# Patient Record
Sex: Male | Born: 1935 | Race: White | Hispanic: No | Marital: Married | Smoking: Former smoker
Health system: Southern US, Community
[De-identification: ages and names within clinical notes are randomized; demographics above are authoritative.]

## PROBLEM LIST (undated history)

## (undated) DIAGNOSIS — I8390 Asymptomatic varicose veins of unspecified lower extremity: Secondary | ICD-10-CM

## (undated) HISTORY — PX: FRACTURE SURGERY: SHX138

## (undated) HISTORY — DX: Asymptomatic varicose veins of unspecified lower extremity: I83.90

---

## 1994-07-30 HISTORY — PX: HERNIA REPAIR: SHX51

## 2006-01-10 ENCOUNTER — Emergency Department: Payer: Self-pay | Admitting: Emergency Medicine

## 2006-01-23 ENCOUNTER — Inpatient Hospital Stay: Payer: Self-pay | Admitting: Internal Medicine

## 2006-01-23 ENCOUNTER — Other Ambulatory Visit: Payer: Self-pay

## 2006-02-04 ENCOUNTER — Ambulatory Visit: Payer: Self-pay | Admitting: Oncology

## 2006-02-05 LAB — PROTIME-INR (CHCC SATELLITE)
INR: 1.8 — ABNORMAL LOW (ref 2.0–3.5)
Protime: 16.7 Seconds — ABNORMAL HIGH (ref 10.6–13.4)

## 2006-02-05 LAB — CBC WITH DIFFERENTIAL (CANCER CENTER ONLY)
BASO#: 0.1 10*3/uL (ref 0.0–0.2)
BASO%: 0.8 % (ref 0.0–2.0)
EOS%: 2.2 % (ref 0.0–7.0)
Eosinophils Absolute: 0.1 10*3/uL (ref 0.0–0.5)
HCT: 45.8 % (ref 38.7–49.9)
HGB: 14.8 g/dL (ref 13.0–17.1)
LYMPH#: 2.1 10*3/uL (ref 0.9–3.3)
LYMPH%: 33.9 % (ref 14.0–48.0)
MCH: 28.4 pg (ref 28.0–33.4)
MCHC: 31.7 g/dL — ABNORMAL LOW (ref 32.0–35.9)
MCV: 90 fL (ref 82–98)
MONO#: 0.4 10*3/uL (ref 0.1–0.9)
MONO%: 6.6 % (ref 0.0–13.0)
NEUT#: 3.6 10*3/uL (ref 1.5–6.5)
NEUT%: 56.5 % (ref 40.0–80.0)
Platelets: 248 10*3/uL (ref 145–400)
RBC: 5.1 10*6/uL (ref 4.20–5.70)
RDW: 12.1 % (ref 10.5–14.6)
WBC: 6.3 10*3/uL (ref 4.0–10.0)

## 2006-02-08 LAB — PROTIME-INR (CHCC SATELLITE): INR: 1.8 — ABNORMAL LOW (ref 2.0–3.5)

## 2006-02-11 LAB — HYPERCOAGULABLE PANEL, COMPREHENSIVE
Anticardiolipin IgA: 13 [APL'U] (ref <13)
Anticardiolipin IgM: <7 [MPL'U] (ref <10)
Beta-2 Glyco I IgG: <4 U/mL (ref <20)
DRVVT 1:1 Mix: 40.1 secs (ref 26.75–42.95)
PTTLA 4:1 Mix: 54.8 secs — ABNORMAL HIGH (ref 30.5–43.1)
Protein C Activity: 48 % — ABNORMAL LOW (ref 91–147)
Protein C, Total: 55 % — ABNORMAL LOW (ref 63–153)
Protein S Activity: 41 % — ABNORMAL LOW (ref 81–180)

## 2006-02-11 LAB — PROTIME-INR (CHCC SATELLITE)

## 2006-02-11 LAB — COMPREHENSIVE METABOLIC PANEL
ALT: 37 U/L (ref 0–40)
AST: 36 U/L (ref 0–37)
Albumin: 4 g/dL (ref 3.5–5.2)
Alkaline Phosphatase: 69 U/L (ref 39–117)
BUN: 10 mg/dL (ref 6–23)
Calcium: 9.4 mg/dL (ref 8.4–10.5)
Chloride: 102 mEq/L (ref 96–112)
Potassium: 4.2 mEq/L (ref 3.5–5.3)
Sodium: 137 mEq/L (ref 135–145)
Total Protein: 6.6 g/dL (ref 6.0–8.3)

## 2006-02-12 LAB — PROTIME-INR (CHCC SATELLITE)

## 2006-02-14 LAB — PROTIME-INR (CHCC SATELLITE)
INR: 2.2 (ref 2.0–3.5)
Protime: 18.3 Seconds — ABNORMAL HIGH (ref 10.6–13.4)

## 2006-02-21 LAB — PROTIME-INR (CHCC SATELLITE): Protime: 19.5 Seconds — ABNORMAL HIGH (ref 10.6–13.4)

## 2006-02-28 LAB — PROTIME-INR (CHCC SATELLITE): INR: 2.6 (ref 2.0–3.5)

## 2006-03-12 LAB — PROTIME-INR (CHCC SATELLITE): INR: 2.8 (ref 2.0–3.5)

## 2006-03-19 ENCOUNTER — Other Ambulatory Visit: Payer: Self-pay

## 2006-03-19 ENCOUNTER — Inpatient Hospital Stay: Payer: Self-pay | Admitting: Internal Medicine

## 2006-03-22 ENCOUNTER — Ambulatory Visit: Payer: Self-pay | Admitting: Oncology

## 2006-03-26 LAB — PROTIME-INR (CHCC SATELLITE)
INR: 2 (ref 2.0–3.5)
Protime: 17.3 Seconds — ABNORMAL HIGH (ref 10.6–13.4)

## 2006-04-09 LAB — PROTIME-INR (CHCC SATELLITE): Protime: 39.6 Seconds — ABNORMAL HIGH (ref 10.6–13.4)

## 2006-05-02 ENCOUNTER — Ambulatory Visit: Payer: Self-pay | Admitting: Oncology

## 2006-06-05 LAB — PROTIME-INR (CHCC SATELLITE)
INR: 2.8 (ref 2.0–3.5)
Protime: 33.6 Seconds — ABNORMAL HIGH (ref 10.6–13.4)

## 2006-07-02 ENCOUNTER — Ambulatory Visit: Payer: Self-pay | Admitting: Oncology

## 2006-07-03 LAB — PROTIME-INR (CHCC SATELLITE)

## 2006-07-16 LAB — PROTIME-INR (CHCC SATELLITE): Protime: 28.8 Seconds — ABNORMAL HIGH (ref 10.6–13.4)

## 2006-11-09 IMAGING — XA DG OUTSIDE FILMS CHEST
5 series · 15 of 24 positions shown · non-contrast
Comparison: none

[Series 1: run · 6 of 19 slices shown (1 of 5)]
[im 1/19]
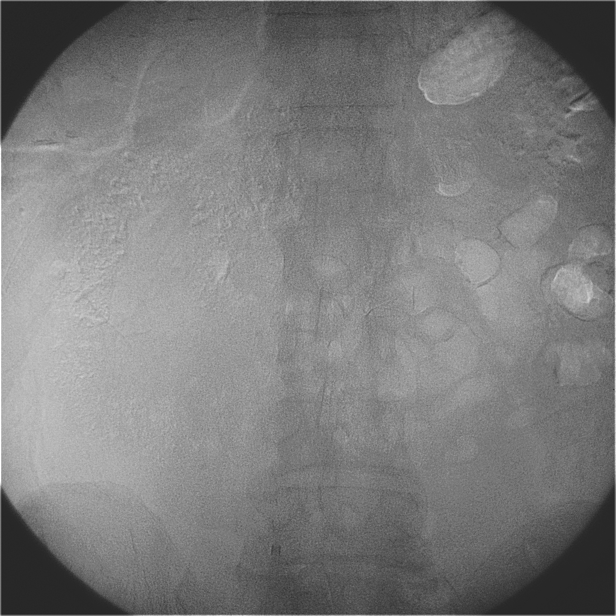
[im 5/19]
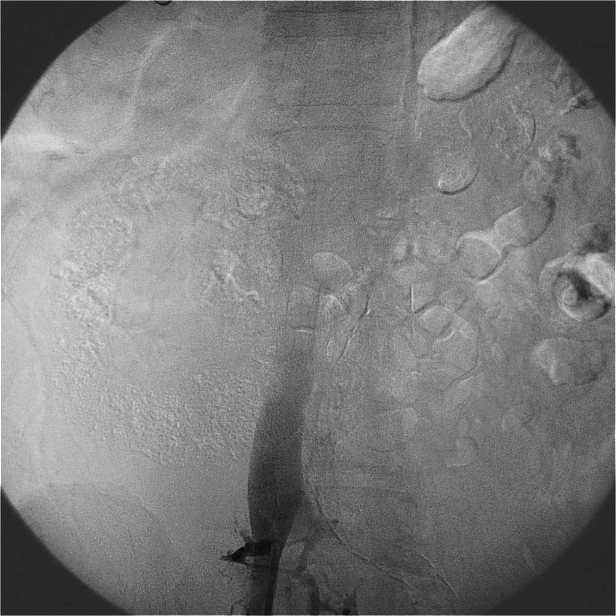
[im 10/19]
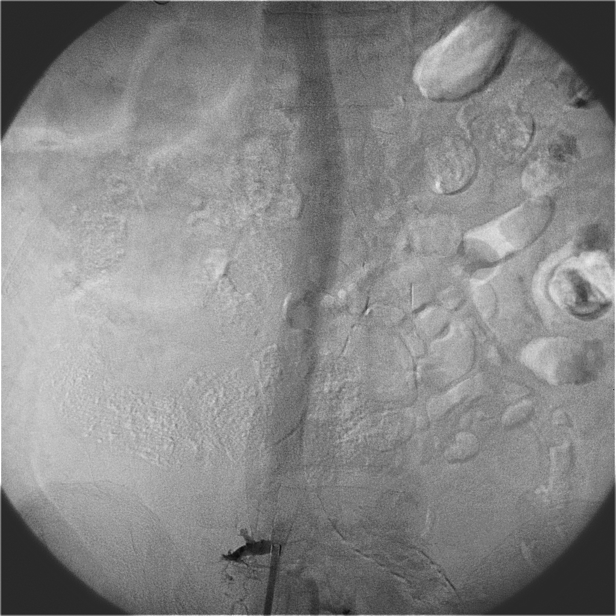
[im 12/19]
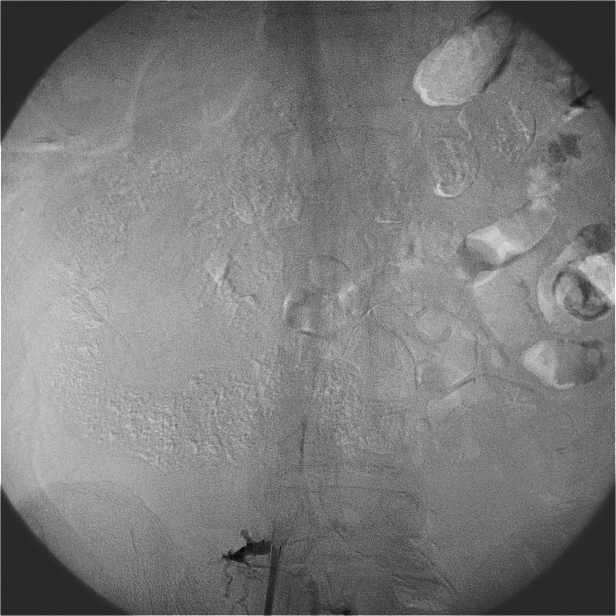
[im 16/19]
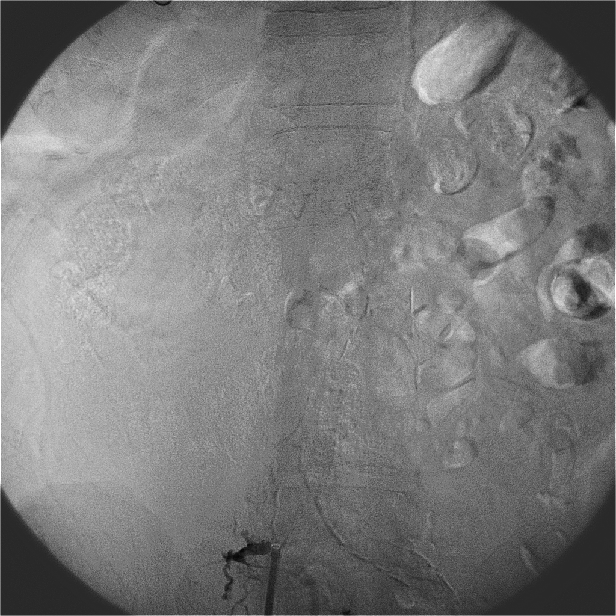
[im 19/19]
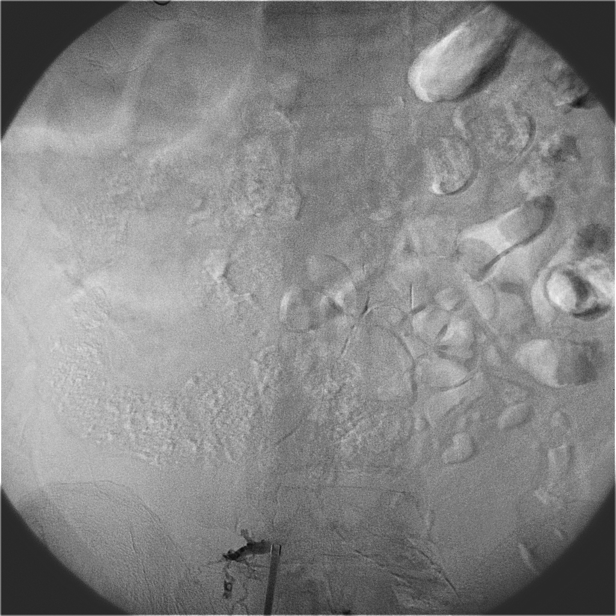

[Series 1: run · 1 of 6 slices shown (2 of 5)]
[im 3/6]
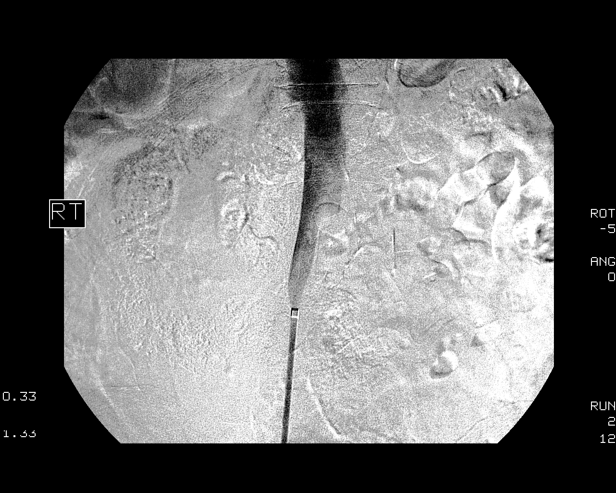

[Series 2: run · 4 of 12 slices shown (3 of 5)]
[im 1/12]
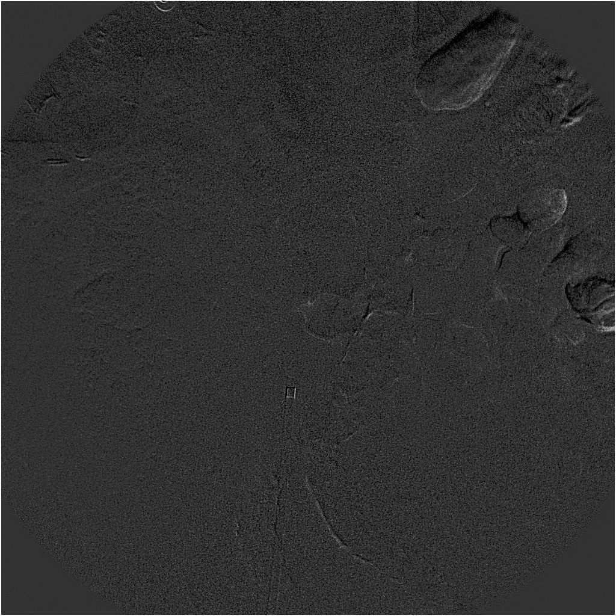
[im 3/12]
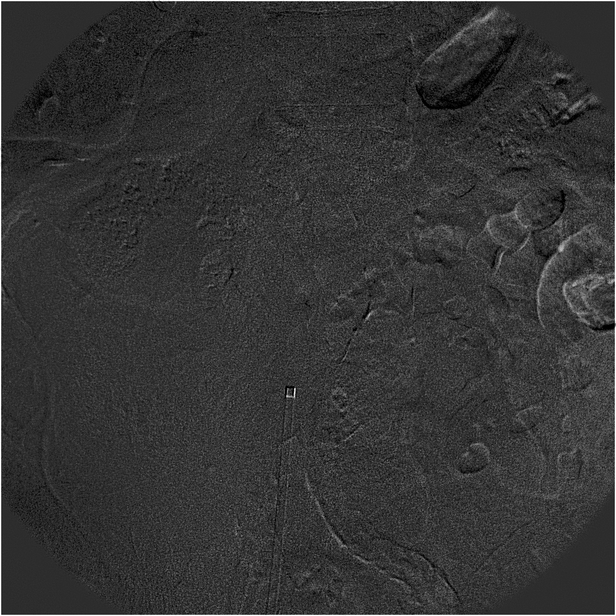
[im 7/12]
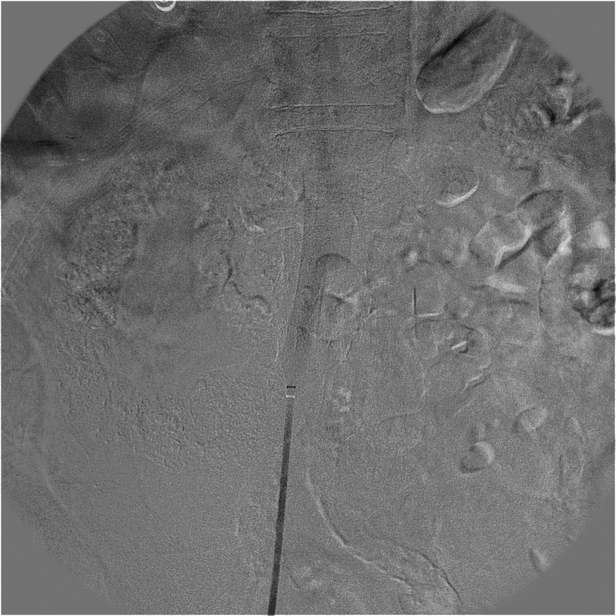
[im 9/12]
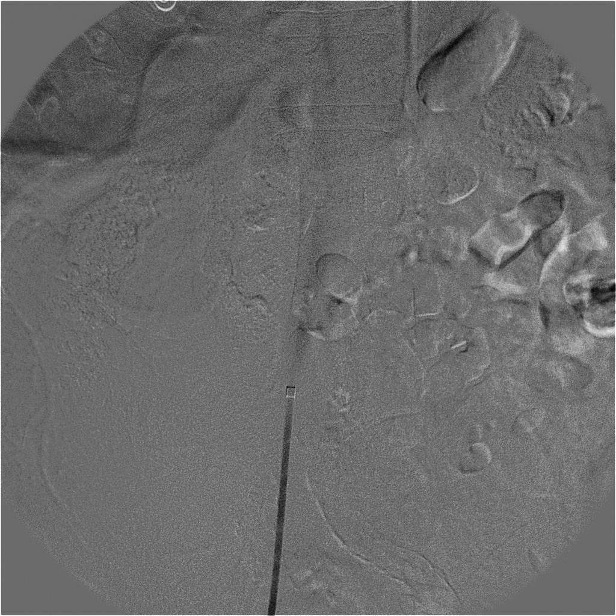

[Series 3: run · 3 of 9 slices shown (4 of 5)]
[im 1/9]
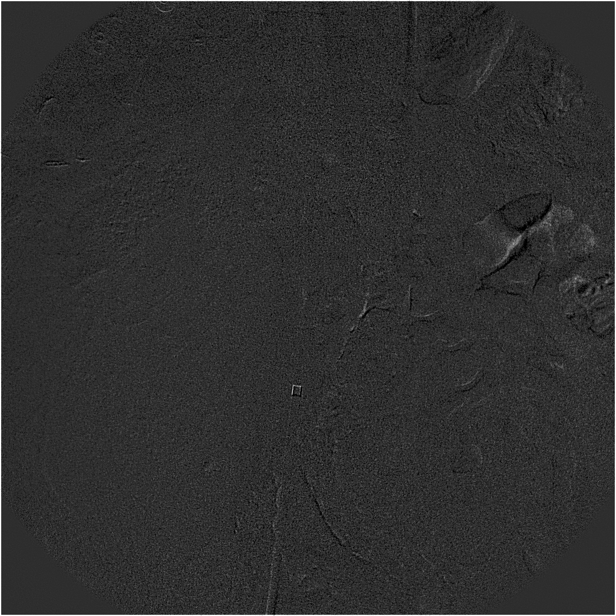
[im 5/9]
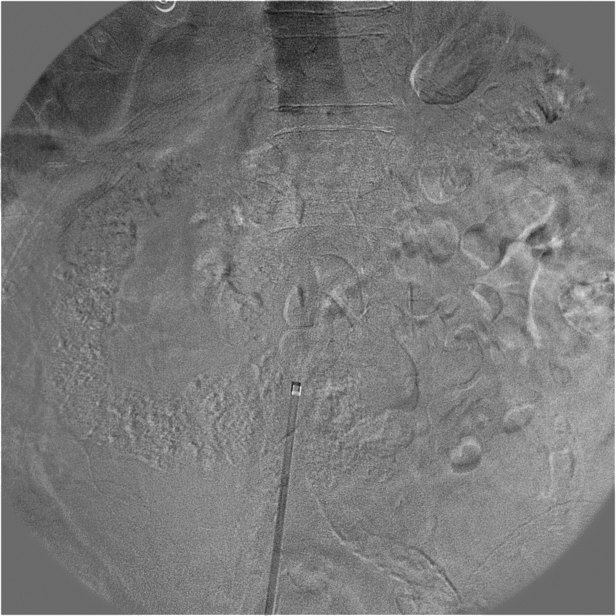
[im 7/9]
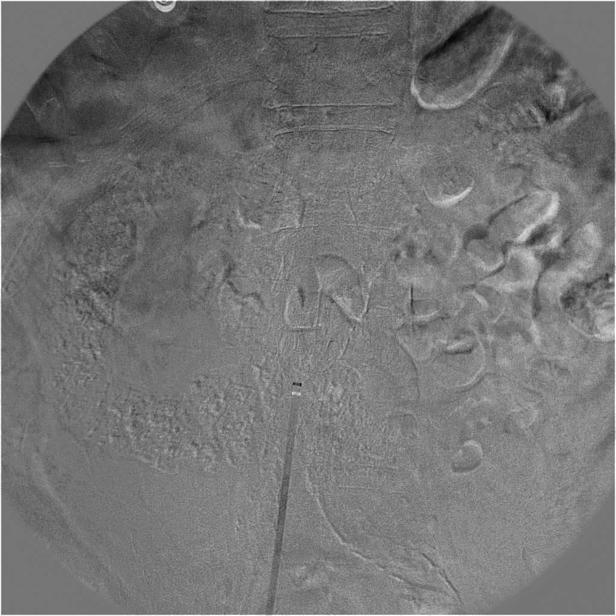

[Series 4: run · 1 of 1 slices shown (5 of 5)]
[im 1/1]
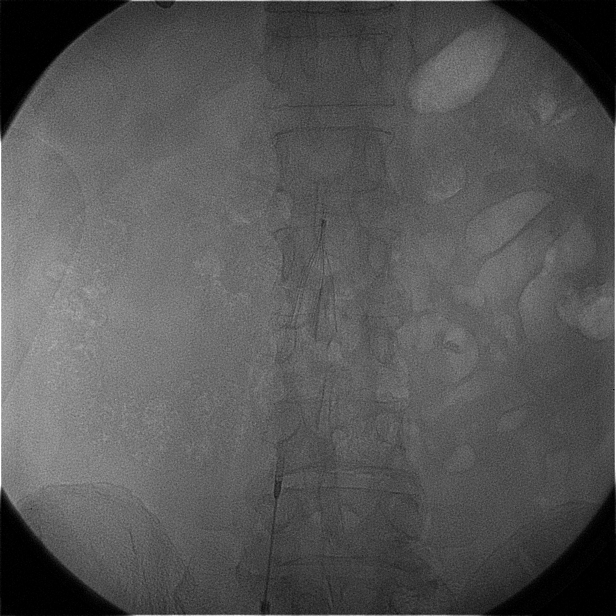

[15 of 24 positions shown; findings below may reference images not displayed]

**** An original report or order could not be provided from the [HOSPITAL] [facility] ****

## 2009-01-05 ENCOUNTER — Ambulatory Visit: Payer: Self-pay | Admitting: Ophthalmology

## 2009-01-17 ENCOUNTER — Ambulatory Visit: Payer: Self-pay | Admitting: Ophthalmology

## 2009-07-27 ENCOUNTER — Ambulatory Visit: Payer: Self-pay | Admitting: Internal Medicine

## 2010-04-27 ENCOUNTER — Ambulatory Visit: Payer: Self-pay | Admitting: Cardiovascular Disease

## 2010-08-25 ENCOUNTER — Ambulatory Visit: Payer: Self-pay | Admitting: Otolaryngology

## 2012-02-19 ENCOUNTER — Encounter: Payer: Self-pay | Admitting: Physician Assistant

## 2012-02-28 ENCOUNTER — Encounter: Payer: Self-pay | Admitting: Physician Assistant

## 2012-11-06 ENCOUNTER — Ambulatory Visit (INDEPENDENT_AMBULATORY_CARE_PROVIDER_SITE_OTHER): Payer: Medicare Other | Admitting: General Surgery

## 2012-11-06 ENCOUNTER — Encounter: Payer: Self-pay | Admitting: General Surgery

## 2012-11-06 VITALS — BP 124/84 | HR 80 | Resp 16 | Ht 67.0 in | Wt 204.0 lb

## 2012-11-06 DIAGNOSIS — I8390 Asymptomatic varicose veins of unspecified lower extremity: Secondary | ICD-10-CM

## 2012-11-06 DIAGNOSIS — I872 Venous insufficiency (chronic) (peripheral): Secondary | ICD-10-CM

## 2012-11-06 DIAGNOSIS — I8393 Asymptomatic varicose veins of bilateral lower extremities: Secondary | ICD-10-CM

## 2012-11-06 NOTE — Patient Instructions (Addendum)
Patient to elevate his feet two times daily. Also at night. This was explained in detail. Also may want to consider a compression pump.

## 2012-11-06 NOTE — Progress Notes (Signed)
Patient ID: Justin Mcdonald, male   DOB: September 01, 1926, 79 y.o.   MRN: [id number]  Chief Complaint  Patient presents with  . Other    veins    HPI Justin Mcdonald is a 79 y.o. male here today for follow up varicose  veins.  Wife states he does not keep his legs elevated enough, swelling some.  Patient denies leg pain. Pt has history of extensive DVT with subsequent recanalisation and now has edema and skin changes in leg from venous insufficiency. Unable to place compression hose or the Circaid.         HPI  Past Medical History  Diagnosis Date  . Varicose vein of leg     Past Surgical History  Procedure Laterality Date  . Fracture surgery  294    leg  . Hernia repair  1996    History reviewed. No pertinent family history.  Social History History  Substance Use Topics  . Smoking status: Former Games developer  . Smokeless tobacco: Not on file  . Alcohol Use: No    No Known Allergies  No current outpatient prescriptions on file.   No current facility-administered medications for this visit.    Review of Systems Review of Systems  Constitutional: Negative.   Respiratory: Negative.   Cardiovascular: Negative.     Blood pressure 124/84, pulse 80, resp. rate 16, height 5\' 7"  (1.702 m), weight 204 lb (92.534 kg).  Physical Exam Physical Exam  Constitutional: He appears well-developed and well-nourished.  Neck: Normal range of motion. Neck supple.  Cardiovascular: Normal rate, regular rhythm and normal heart sounds.   Pulses:      Dorsalis pedis pulses are 3+ on the right side, and 3+ on the left side.       Posterior tibial pulses are 3+ on the right side, and 3+ on the left side.  There mild to moderate edema i both legs with brown discoloration of skin in lower 1/3 of both legs-left more than right. No skin induration or tenderness noted.  Pulmonary/Chest: Effort normal.  Abdominal: Soft. Normal appearance.  Skin:  Patient has not been able to wear any stoking's due to can't get  them on.    Data Reviewed none  Assessment    Chronic venous insufficiency.     Plan    Discussed need for compression, leg elevation.        Ammara Raj G 11/07/2012, 7:12 AM

## 2012-11-07 ENCOUNTER — Encounter: Payer: Self-pay | Admitting: General Surgery

## 2012-11-07 DIAGNOSIS — I872 Venous insufficiency (chronic) (peripheral): Secondary | ICD-10-CM | POA: Insufficient documentation

## 2013-05-19 ENCOUNTER — Ambulatory Visit: Payer: PRIVATE HEALTH INSURANCE | Admitting: General Surgery

## 2013-06-16 ENCOUNTER — Encounter: Payer: Self-pay | Admitting: *Deleted

## 2013-07-02 ENCOUNTER — Encounter: Payer: Self-pay | Admitting: General Surgery

## 2013-07-02 ENCOUNTER — Ambulatory Visit (INDEPENDENT_AMBULATORY_CARE_PROVIDER_SITE_OTHER): Payer: Medicare Other | Admitting: General Surgery

## 2013-07-02 VITALS — BP 110/68 | HR 86 | Resp 14 | Ht 67.0 in | Wt 206.0 lb

## 2013-07-02 DIAGNOSIS — I872 Venous insufficiency (chronic) (peripheral): Secondary | ICD-10-CM

## 2013-07-02 NOTE — Progress Notes (Signed)
Patient presents for a follow up of varicose veins. The patient states he has occasional pain and swelling in the lower extremities. He does not wear his compression stockings due to not having the strength to put them on.  Pt has history of extensive DVT with subsequent recanalisation and now has edema and skin changes in leg from venous insufficiency. Unable to place compression hose or the Circaid. Exam shows both legs with minimal edema, much better than in the past. The stasis skin pigmentation is noted in the lower 1/3 of both sides.  Feet are warm. DP and PT pulses are 2+ on both sides.  Patient to return prn. Advised to elevated his legs as much as possible since he has trouble with compression hose. Potential problems of venous stasis explained to him and his wife.

## 2013-07-02 NOTE — Patient Instructions (Signed)
Return as needed

## 2013-09-16 LAB — BASIC METABOLIC PANEL
Anion Gap: 6 — ABNORMAL LOW (ref 7–16)
BUN: 18 mg/dL (ref 7–18)
CHLORIDE: 105 mmol/L (ref 98–107)
Calcium, Total: 9.3 mg/dL (ref 8.5–10.1)
Co2: 27 mmol/L (ref 21–32)
Creatinine: 0.92 mg/dL (ref 0.60–1.30)
EGFR (Non-African Amer.): >60
Glucose: 113 mg/dL — ABNORMAL HIGH (ref 65–99)
Osmolality: 278 (ref 275–301)
POTASSIUM: 3.7 mmol/L (ref 3.5–5.1)
SODIUM: 138 mmol/L (ref 136–145)

## 2013-09-16 LAB — CBC WITH DIFFERENTIAL/PLATELET
Basophil #: 0 10*3/uL (ref 0.0–0.1)
Basophil %: 0.3 %
Eosinophil #: 0.3 10*3/uL (ref 0.0–0.7)
Eosinophil %: 2.4 %
HCT: 46.4 % (ref 40.0–52.0)
HGB: 14.9 g/dL (ref 13.0–18.0)
LYMPHS ABS: 3 10*3/uL (ref 1.0–3.6)
LYMPHS PCT: 28.3 %
MCH: 29.2 pg (ref 26.0–34.0)
MCHC: 32.1 g/dL (ref 32.0–36.0)
MCV: 91 fL (ref 80–100)
Monocyte #: 1.3 x10 3/mm — ABNORMAL HIGH (ref 0.2–1.0)
Monocyte %: 12.4 %
NEUTROS PCT: 56.6 %
Neutrophil #: 6.1 10*3/uL (ref 1.4–6.5)
Platelet: 214 10*3/uL (ref 150–440)
RBC: 5.1 10*6/uL (ref 4.40–5.90)
RDW: 13.3 % (ref 11.5–14.5)
WBC: 10.7 10*3/uL — ABNORMAL HIGH (ref 3.8–10.6)

## 2013-09-16 LAB — TROPONIN I: Troponin-I: <0.02 ng/mL

## 2013-09-17 ENCOUNTER — Inpatient Hospital Stay: Payer: Self-pay | Admitting: Internal Medicine

## 2013-09-17 LAB — TROPONIN I: Troponin-I: <0.02 ng/mL

## 2013-09-17 LAB — URINALYSIS, COMPLETE
BLOOD: NEGATIVE
Bilirubin,UR: NEGATIVE
Glucose,UR: NEGATIVE mg/dL (ref 0–75)
Hyaline Cast: 10
Ketone: NEGATIVE
LEUKOCYTE ESTERASE: NEGATIVE
NITRITE: NEGATIVE
Ph: 5 (ref 4.5–8.0)
Protein: 30
Specific Gravity: 1.028 (ref 1.003–1.030)
WBC UR: 3 /HPF (ref 0–5)

## 2013-09-17 LAB — BASIC METABOLIC PANEL
Anion Gap: 5 — ABNORMAL LOW (ref 7–16)
BUN: 19 mg/dL — ABNORMAL HIGH (ref 7–18)
CALCIUM: 9.2 mg/dL (ref 8.5–10.1)
CREATININE: 0.8 mg/dL (ref 0.60–1.30)
Chloride: 107 mmol/L (ref 98–107)
Co2: 25 mmol/L (ref 21–32)
EGFR (African American): >60
EGFR (Non-African Amer.): >60
Glucose: 104 mg/dL — ABNORMAL HIGH (ref 65–99)
OSMOLALITY: 276 (ref 275–301)
POTASSIUM: 4.1 mmol/L (ref 3.5–5.1)
Sodium: 137 mmol/L (ref 136–145)

## 2013-09-17 LAB — LIPID PANEL
Cholesterol: 148 mg/dL (ref 0–200)
HDL: 33 mg/dL — AB (ref 40–60)
LDL CHOLESTEROL, CALC: 98 mg/dL (ref 0–100)
Triglycerides: 86 mg/dL (ref 0–200)
VLDL CHOLESTEROL, CALC: 17 mg/dL (ref 5–40)

## 2013-09-17 LAB — PRO B NATRIURETIC PEPTIDE: B-Type Natriuretic Peptide: 51 pg/mL (ref 0–450)

## 2013-09-17 LAB — WBC: WBC: 9.2 10*3/uL (ref 3.8–10.6)

## 2013-09-17 LAB — CK-MB
CK-MB: 0.9 ng/mL (ref 0.5–3.6)
CK-MB: 1.4 ng/mL (ref 0.5–3.6)
CK-MB: 1 ng/mL (ref 0.5–3.6)

## 2013-09-17 LAB — TSH: Thyroid Stimulating Horm: 0.79 u[IU]/mL

## 2013-09-18 LAB — CBC WITH DIFFERENTIAL/PLATELET
BASOS PCT: 0.2 %
Basophil #: 0 10*3/uL (ref 0.0–0.1)
Eosinophil #: 0.1 10*3/uL (ref 0.0–0.7)
Eosinophil %: 1 %
HCT: 37 % — AB (ref 40.0–52.0)
HGB: 12.2 g/dL — AB (ref 13.0–18.0)
Lymphocyte #: 1.4 10*3/uL (ref 1.0–3.6)
Lymphocyte %: 16.7 %
MCH: 29.7 pg (ref 26.0–34.0)
MCHC: 32.9 g/dL (ref 32.0–36.0)
MCV: 90 fL (ref 80–100)
MONOS PCT: 16 %
Monocyte #: 1.4 x10 3/mm — ABNORMAL HIGH (ref 0.2–1.0)
Neutrophil #: 5.7 10*3/uL (ref 1.4–6.5)
Neutrophil %: 66.1 %
Platelet: 127 10*3/uL — ABNORMAL LOW (ref 150–440)
RBC: 4.1 10*6/uL — ABNORMAL LOW (ref 4.40–5.90)
RDW: 13.2 % (ref 11.5–14.5)
WBC: 8.6 10*3/uL (ref 3.8–10.6)

## 2013-09-18 LAB — BASIC METABOLIC PANEL
ANION GAP: 5 — AB (ref 7–16)
BUN: 16 mg/dL (ref 7–18)
Calcium, Total: 8.3 mg/dL — ABNORMAL LOW (ref 8.5–10.1)
Chloride: 108 mmol/L — ABNORMAL HIGH (ref 98–107)
Co2: 26 mmol/L (ref 21–32)
Creatinine: 0.6 mg/dL (ref 0.60–1.30)
Glucose: 102 mg/dL — ABNORMAL HIGH (ref 65–99)
OSMOLALITY: 279 (ref 275–301)
Potassium: 3.9 mmol/L (ref 3.5–5.1)
Sodium: 139 mmol/L (ref 136–145)

## 2013-09-18 LAB — URINE CULTURE

## 2013-09-20 LAB — CBC WITH DIFFERENTIAL/PLATELET
BASOS PCT: 0.4 %
Basophil #: 0 10*3/uL (ref 0.0–0.1)
EOS ABS: 0.1 10*3/uL (ref 0.0–0.7)
Eosinophil %: 1.4 %
HCT: 37 % — ABNORMAL LOW (ref 40.0–52.0)
HGB: 12 g/dL — ABNORMAL LOW (ref 13.0–18.0)
LYMPHS ABS: 1.6 10*3/uL (ref 1.0–3.6)
LYMPHS PCT: 18.6 %
MCH: 29.1 pg (ref 26.0–34.0)
MCHC: 32.4 g/dL (ref 32.0–36.0)
MCV: 90 fL (ref 80–100)
MONO ABS: 1.5 x10 3/mm — AB (ref 0.2–1.0)
Monocyte %: 18.2 %
NEUTROS ABS: 5.2 10*3/uL (ref 1.4–6.5)
Neutrophil %: 61.4 %
Platelet: 154 10*3/uL (ref 150–440)
RBC: 4.12 10*6/uL — AB (ref 4.40–5.90)
RDW: 13.2 % (ref 11.5–14.5)
WBC: 8.4 10*3/uL (ref 3.8–10.6)

## 2013-09-22 DIAGNOSIS — R55 Syncope and collapse: Secondary | ICD-10-CM

## 2013-09-22 DIAGNOSIS — J449 Chronic obstructive pulmonary disease, unspecified: Secondary | ICD-10-CM

## 2013-09-22 DIAGNOSIS — F068 Other specified mental disorders due to known physiological condition: Secondary | ICD-10-CM

## 2013-09-22 DIAGNOSIS — I82409 Acute embolism and thrombosis of unspecified deep veins of unspecified lower extremity: Secondary | ICD-10-CM

## 2013-10-01 DIAGNOSIS — F068 Other specified mental disorders due to known physiological condition: Secondary | ICD-10-CM

## 2013-10-01 DIAGNOSIS — I82409 Acute embolism and thrombosis of unspecified deep veins of unspecified lower extremity: Secondary | ICD-10-CM

## 2013-10-01 DIAGNOSIS — J449 Chronic obstructive pulmonary disease, unspecified: Secondary | ICD-10-CM

## 2013-10-01 DIAGNOSIS — I872 Venous insufficiency (chronic) (peripheral): Secondary | ICD-10-CM

## 2013-11-06 ENCOUNTER — Inpatient Hospital Stay (HOSPITAL_COMMUNITY)
Admission: EM | Admit: 2013-11-06 | Discharge: 2013-11-27 | DRG: 871 | Disposition: E | Payer: Medicare Other | Attending: Internal Medicine | Admitting: Internal Medicine

## 2013-11-06 ENCOUNTER — Emergency Department (HOSPITAL_COMMUNITY): Payer: Medicare Other

## 2013-11-06 ENCOUNTER — Encounter (HOSPITAL_COMMUNITY): Payer: Self-pay | Admitting: Emergency Medicine

## 2013-11-06 DIAGNOSIS — I5032 Chronic diastolic (congestive) heart failure: Secondary | ICD-10-CM

## 2013-11-06 DIAGNOSIS — R29898 Other symptoms and signs involving the musculoskeletal system: Secondary | ICD-10-CM | POA: Diagnosis present

## 2013-11-06 DIAGNOSIS — I82509 Chronic embolism and thrombosis of unspecified deep veins of unspecified lower extremity: Secondary | ICD-10-CM | POA: Diagnosis present

## 2013-11-06 DIAGNOSIS — Q2111 Secundum atrial septal defect: Secondary | ICD-10-CM | POA: Diagnosis present

## 2013-11-06 DIAGNOSIS — R4701 Aphasia: Secondary | ICD-10-CM | POA: Diagnosis present

## 2013-11-06 DIAGNOSIS — J209 Acute bronchitis, unspecified: Secondary | ICD-10-CM

## 2013-11-06 DIAGNOSIS — Z7901 Long term (current) use of anticoagulants: Secondary | ICD-10-CM

## 2013-11-06 DIAGNOSIS — E876 Hypokalemia: Secondary | ICD-10-CM | POA: Diagnosis present

## 2013-11-06 DIAGNOSIS — J9601 Acute respiratory failure with hypoxia: Secondary | ICD-10-CM | POA: Diagnosis present

## 2013-11-06 DIAGNOSIS — R531 Weakness: Secondary | ICD-10-CM

## 2013-11-06 DIAGNOSIS — G9340 Encephalopathy, unspecified: Secondary | ICD-10-CM | POA: Diagnosis present

## 2013-11-06 DIAGNOSIS — Q211 Atrial septal defect: Secondary | ICD-10-CM | POA: Diagnosis present

## 2013-11-06 DIAGNOSIS — A419 Sepsis, unspecified organism: Principal | ICD-10-CM

## 2013-11-06 DIAGNOSIS — D649 Anemia, unspecified: Secondary | ICD-10-CM | POA: Diagnosis present

## 2013-11-06 DIAGNOSIS — R4182 Altered mental status, unspecified: Secondary | ICD-10-CM

## 2013-11-06 DIAGNOSIS — I509 Heart failure, unspecified: Secondary | ICD-10-CM | POA: Diagnosis present

## 2013-11-06 DIAGNOSIS — Z87891 Personal history of nicotine dependence: Secondary | ICD-10-CM

## 2013-11-06 DIAGNOSIS — R7881 Bacteremia: Secondary | ICD-10-CM

## 2013-11-06 DIAGNOSIS — I76 Septic arterial embolism: Secondary | ICD-10-CM | POA: Diagnosis present

## 2013-11-06 DIAGNOSIS — I330 Acute and subacute infective endocarditis: Secondary | ICD-10-CM

## 2013-11-06 DIAGNOSIS — B952 Enterococcus as the cause of diseases classified elsewhere: Secondary | ICD-10-CM

## 2013-11-06 DIAGNOSIS — J9600 Acute respiratory failure, unspecified whether with hypoxia or hypercapnia: Secondary | ICD-10-CM

## 2013-11-06 DIAGNOSIS — I82409 Acute embolism and thrombosis of unspecified deep veins of unspecified lower extremity: Secondary | ICD-10-CM

## 2013-11-06 DIAGNOSIS — R4789 Other speech disturbances: Secondary | ICD-10-CM | POA: Diagnosis present

## 2013-11-06 DIAGNOSIS — R5381 Other malaise: Secondary | ICD-10-CM | POA: Diagnosis present

## 2013-11-06 DIAGNOSIS — J438 Other emphysema: Secondary | ICD-10-CM | POA: Diagnosis present

## 2013-11-06 DIAGNOSIS — Z66 Do not resuscitate: Secondary | ICD-10-CM | POA: Diagnosis present

## 2013-11-06 DIAGNOSIS — Z113 Encounter for screening for infections with a predominantly sexual mode of transmission: Secondary | ICD-10-CM

## 2013-11-06 DIAGNOSIS — F0390 Unspecified dementia without behavioral disturbance: Secondary | ICD-10-CM | POA: Diagnosis present

## 2013-11-06 DIAGNOSIS — I4510 Unspecified right bundle-branch block: Secondary | ICD-10-CM | POA: Diagnosis present

## 2013-11-06 DIAGNOSIS — I63239 Cerebral infarction due to unspecified occlusion or stenosis of unspecified carotid arteries: Secondary | ICD-10-CM

## 2013-11-06 DIAGNOSIS — R319 Hematuria, unspecified: Secondary | ICD-10-CM

## 2013-11-06 DIAGNOSIS — I6340 Cerebral infarction due to embolism of unspecified cerebral artery: Secondary | ICD-10-CM

## 2013-11-06 DIAGNOSIS — B9689 Other specified bacterial agents as the cause of diseases classified elsewhere: Secondary | ICD-10-CM | POA: Diagnosis present

## 2013-11-06 DIAGNOSIS — R5383 Other fatigue: Secondary | ICD-10-CM

## 2013-11-06 DIAGNOSIS — I4891 Unspecified atrial fibrillation: Secondary | ICD-10-CM | POA: Diagnosis not present

## 2013-11-06 DIAGNOSIS — Z86718 Personal history of other venous thrombosis and embolism: Secondary | ICD-10-CM

## 2013-11-06 DIAGNOSIS — I5030 Unspecified diastolic (congestive) heart failure: Secondary | ICD-10-CM

## 2013-11-06 DIAGNOSIS — Z515 Encounter for palliative care: Secondary | ICD-10-CM

## 2013-11-06 LAB — COMPREHENSIVE METABOLIC PANEL
ALT: 18 U/L (ref 0–53)
AST: 29 U/L (ref 0–37)
Albumin: 2.9 g/dL — ABNORMAL LOW (ref 3.5–5.2)
Alkaline Phosphatase: 123 U/L — ABNORMAL HIGH (ref 39–117)
BILIRUBIN TOTAL: 0.7 mg/dL (ref 0.3–1.2)
BUN: 19 mg/dL (ref 6–23)
CALCIUM: 9.3 mg/dL (ref 8.4–10.5)
CHLORIDE: 94 meq/L — AB (ref 96–112)
CO2: 21 mEq/L (ref 19–32)
Creatinine, Ser: 0.68 mg/dL (ref 0.50–1.35)
GFR, EST NON AFRICAN AMERICAN: 85 mL/min — AB (ref >90)
GLUCOSE: 115 mg/dL — AB (ref 70–99)
Potassium: 4.2 mEq/L (ref 3.7–5.3)
SODIUM: 134 meq/L — AB (ref 137–147)
Total Protein: 7.4 g/dL (ref 6.0–8.3)

## 2013-11-06 LAB — I-STAT CHEM 8, ED
BUN: 18 mg/dL (ref 6–23)
Calcium, Ion: 1.12 mmol/L — ABNORMAL LOW (ref 1.13–1.30)
Chloride: 99 mEq/L (ref 96–112)
Creatinine, Ser: 0.9 mg/dL (ref 0.50–1.35)
Glucose, Bld: 121 mg/dL — ABNORMAL HIGH (ref 70–99)
HCT: 41 % (ref 39.0–52.0)
Hemoglobin: 13.9 g/dL (ref 13.0–17.0)
Potassium: 4 mEq/L (ref 3.7–5.3)
SODIUM: 132 meq/L — AB (ref 137–147)
TCO2: 23 mmol/L (ref 0–100)

## 2013-11-06 LAB — URINALYSIS, ROUTINE W REFLEX MICROSCOPIC
GLUCOSE, UA: NEGATIVE mg/dL
Hgb urine dipstick: NEGATIVE
Ketones, ur: 15 mg/dL — AB
Leukocytes, UA: NEGATIVE
Nitrite: NEGATIVE
PH: 6 (ref 5.0–8.0)
PROTEIN: NEGATIVE mg/dL
SPECIFIC GRAVITY, URINE: 1.037 — AB (ref 1.005–1.030)
UROBILINOGEN UA: 1 mg/dL (ref 0.0–1.0)

## 2013-11-06 LAB — RAPID URINE DRUG SCREEN, HOSP PERFORMED
Amphetamines: NOT DETECTED
BENZODIAZEPINES: NOT DETECTED
Barbiturates: NOT DETECTED
Cocaine: NOT DETECTED
Opiates: NOT DETECTED
Tetrahydrocannabinol: NOT DETECTED

## 2013-11-06 LAB — I-STAT ARTERIAL BLOOD GAS, ED
ACID-BASE DEFICIT: 2 mmol/L (ref 0.0–2.0)
BICARBONATE: 21.6 meq/L (ref 20.0–24.0)
O2 Saturation: 99 %
TCO2: 23 mmol/L (ref 0–100)
pCO2 arterial: 33.5 mmHg — ABNORMAL LOW (ref 35.0–45.0)
pH, Arterial: 7.418 (ref 7.350–7.450)
pO2, Arterial: 116 mmHg — ABNORMAL HIGH (ref 80.0–100.0)

## 2013-11-06 LAB — PROTIME-INR
INR: 1.8 — ABNORMAL HIGH (ref 0.00–1.49)
Prothrombin Time: 20.4 seconds — ABNORMAL HIGH (ref 11.6–15.2)

## 2013-11-06 LAB — ETHANOL

## 2013-11-06 LAB — APTT: aPTT: 35 seconds (ref 24–37)

## 2013-11-06 LAB — CBC WITH DIFFERENTIAL/PLATELET
BASOS ABS: 0 10*3/uL (ref 0.0–0.1)
BASOS PCT: 0 % (ref 0–1)
EOS ABS: 0 10*3/uL (ref 0.0–0.7)
EOS PCT: 0 % (ref 0–5)
HCT: 37.5 % — ABNORMAL LOW (ref 39.0–52.0)
Hemoglobin: 12.6 g/dL — ABNORMAL LOW (ref 13.0–17.0)
Lymphocytes Relative: 11 % — ABNORMAL LOW (ref 12–46)
Lymphs Abs: 1.6 10*3/uL (ref 0.7–4.0)
MCH: 27.8 pg (ref 26.0–34.0)
MCHC: 33.6 g/dL (ref 30.0–36.0)
MCV: 82.6 fL (ref 78.0–100.0)
MONO ABS: 1 10*3/uL (ref 0.1–1.0)
Monocytes Relative: 7 % (ref 3–12)
Neutro Abs: 11.7 10*3/uL — ABNORMAL HIGH (ref 1.7–7.7)
Neutrophils Relative %: 82 % — ABNORMAL HIGH (ref 43–77)
Platelets: 247 10*3/uL (ref 150–400)
RBC: 4.54 MIL/uL (ref 4.22–5.81)
RDW: 14.5 % (ref 11.5–15.5)
WBC: 14.4 10*3/uL — ABNORMAL HIGH (ref 4.0–10.5)

## 2013-11-06 LAB — I-STAT TROPONIN, ED: Troponin i, poc: 0.01 ng/mL (ref 0.00–0.08)

## 2013-11-06 LAB — CBG MONITORING, ED: Glucose-Capillary: 115 mg/dL — ABNORMAL HIGH (ref 70–99)

## 2013-11-06 LAB — TROPONIN I: Troponin I: <0.3 ng/mL (ref <0.30)

## 2013-11-06 LAB — I-STAT CG4 LACTIC ACID, ED: Lactic Acid, Venous: 2.21 mmol/L — ABNORMAL HIGH (ref 0.5–2.2)

## 2013-11-06 MED ORDER — CEFEPIME HCL 1 G IJ SOLR
1.0000 g | Freq: Three times a day (TID) | INTRAMUSCULAR | Status: DC
  Administered 2013-11-06: 1 g via INTRAVENOUS
  Filled 2013-11-06 (×3): qty 1

## 2013-11-06 MED ORDER — SODIUM CHLORIDE 0.9 % IV SOLN
1000.0000 mL | Freq: Once | INTRAVENOUS | Status: AC
  Administered 2013-11-06: 1000 mL via INTRAVENOUS

## 2013-11-06 MED ORDER — ACETAMINOPHEN 650 MG RE SUPP
650.0000 mg | RECTAL | Status: AC
  Administered 2013-11-06: 650 mg via RECTAL
  Filled 2013-11-06: qty 1

## 2013-11-06 MED ORDER — SODIUM CHLORIDE 0.9 % IV SOLN: 1000.0000 mL | Freq: Once | INTRAVENOUS | Status: DC

## 2013-11-06 MED ORDER — VANCOMYCIN HCL 10 G IV SOLR
1500.0000 mg | Freq: Once | INTRAVENOUS | Status: AC
  Administered 2013-11-06: 1500 mg via INTRAVENOUS
  Filled 2013-11-06: qty 1500

## 2013-11-06 MED ORDER — SODIUM CHLORIDE 0.9 % IV SOLN: 1000.0000 mL | INTRAVENOUS | Status: DC

## 2013-11-06 MED ORDER — SODIUM CHLORIDE 0.9 % IV SOLN
INTRAVENOUS | Status: AC
  Administered 2013-11-06: 1000 mL via INTRAVENOUS

## 2013-11-06 NOTE — ED Provider Notes (Signed)
CSN: [id number]     Arrival date & time 11/14/2013  2110 History   First MD Initiated Contact with Patient 11/07/2013 2126     Chief Complaint  Patient presents with  . Altered Mental Status     (Consider location/radiation/quality/duration/timing/severity/associated sxs/prior Treatment) The history is provided by the EMS personnel and the spouse.   history of present illness: A 80 year old male with history of DVT on Xarelto who presents via EMS as a code stroke and code stemi. Initial history provided by EMS. They were called out to the patient's residence for altered no status. Later after their arrival, the family reports onset of symptoms approximately 2 hours prior to contacting EMS. On EMS arrival patient was found to be confused, mumbling, with limited movement of the right upper extremity. He was tachycardic, tachypnea, oxygen saturation 77% on room air. He was started on CPAP with improvement in oxygen saturation. Spouse reports patient has not had any recent illness until he became altered today. EMS was concerned for possible STEMI due to read on print out.  Past Medical History  Diagnosis Date  . Varicose vein of leg    Past Surgical History  Procedure Laterality Date  . Fracture surgery  294    leg  . Hernia repair  1996   No family history on file. History  Substance Use Topics  . Smoking status: Former Games developer  . Smokeless tobacco: Not on file  . Alcohol Use: No    Review of Systems  Unable to perform ROS: Mental status change      Allergies  Review of patient's allergies indicates no known allergies.  Home Medications  No current outpatient prescriptions on file. BP 122/62  Pulse 118  Resp 31  SpO2 100% Physical Exam  Nursing note and vitals reviewed. Constitutional: He appears distressed.  HENT:  Head: Normocephalic and atraumatic.  Eyes: Pupils are equal, round, and reactive to light.  Neck: Neck supple.  Cardiovascular: Regular rhythm and normal  pulses.  Tachycardia present.   Pulmonary/Chest: He is in respiratory distress (moderate). He has decreased breath sounds (diminished throughout).  Abdominal: Soft. Normal appearance. There is no tenderness.  Musculoskeletal: He exhibits no edema.  Neurological: He is alert. GCS eye subscore is 4. GCS verbal subscore is 2. GCS motor subscore is 4.  Skin: Skin is warm and dry. No rash noted.    ED Course  Procedures (including critical care time) Labs Review Labs Reviewed - No data to display Imaging Review No results found.   EKG Interpretation None      MDM   Final diagnoses:  None    80 year old male with history of DVT, emphysema, and early dementia who presented initially as a code stroke and code STEMI. Family reported 2 hours of altered mental status preceding symptoms. EMS noted decreased movement of the right upper extremity. Rhythm strip obtained by EMS printed out concern for acute MI. On arrival EKG obtained that showed a right bundle branch block. This was reviewed by cardiology who did not see evidence of acute ST elevation MI. And code ST EMI was canceled.  On arrival patient GCS 10 (4/2/4). Initially no movement of the right upper extremity noted and patient with concern for right-sided neglect. He maintained appropriate oxygen saturation off CPAP but had significantly increased and worsening tachypnea. Tachypnea improved on BiPAP.  With concern for stroke and NIH stroke scale of 18 patient taken directly to CT scan. CT PE study was also obtained with patient's history  of known DVT now with tachypnea and hypoxia.  CT negative for acute ischemia. PE study without evidence of pulmonary embolism or pulmonary edema.  After return from the CT scanner patient was of 102.7. On identification of fever and sepsis called. Patient given IVF, vancomycin and cefepime for empiric HCAP coverage.  Labs remarkable for lactic catheter 2.2, leukocytosis 14.4.  During emergency  department stay patient's mental status improved and he was able to appropriately follow commands. Attempted to wean patient off BiPAP but he remained with tachypnea in the 30s to 40s.  Patient discussed with the critical care attending on call who felt patient appropriate for admission to step down. Hospitalist consult in a.m. will admit to step down for further management.   Date: 11/07/2013  Rate: 121  Rhythm: sinus tachycardia  QRS Axis: normal  Intervals: normal  ST/T Wave abnormalities: normal  Conduction Disutrbances:right bundle branch block  Narrative Interpretation: Sinus tachycardia with RBBB  Old EKG Reviewed: none available and unchanged      Cherre RobinsBryan Sireen Halk, MD 11/07/13 0107

## 2013-11-06 NOTE — ED Notes (Signed)
Pt from home. Pt recently placed on Xyralto for a blood clot in right hip. Was in rehab for generalized weakness and came home earlier today. Wife called EMS and they found pt to be SOB with O2 sats in the 70's. Pt placed on CPAP. 18g PIV inserted in left a/c. Pt unable to follow commands. Right sided weakness noted by EMS.

## 2013-11-06 NOTE — Code Documentation (Signed)
80 [facility] w[facility] with sudden onset Rt side weakness, slurred speech, and resp distress.  Per EMS, pt was at his baseline after coming home from an appt with rehab & his wife later discovered him around 1945 to be altered & having difficulty breathing.  [facility] was on Bip[facility] and nonverbal.  Pt had a Lt gaze preference, Rt side weakness, sensory and visual deficits, and was unable to follow-commands.  Pt is currently taking xarelto for DVT. Pt was incidentally found to be febrile t=102.7 so a code sepsis was also initiated. No acute stroke treatment at this time due to contraindications.  Awaiting wife's arrival for further direction.

## 2013-11-06 NOTE — Progress Notes (Signed)
Dr Christain SacramentoBeaver requested that patient be taken off Bipap and placed on Nasal Cannula.  Patients RR increased into the high 40s to 50s and patient was using more abdominal muscles to breathe.  RT got Dr Christain SacramentoBeaver to come look at patient.  Patient was placed back on bipap at previous settings of 10/5 and 40%.  Tolerating well.  RT will monitor.

## 2013-11-06 NOTE — ED Notes (Signed)
Wife reports pt has intermittent bouts of dementia with memory loss, confusion, and non communication.

## 2013-11-06 NOTE — Progress Notes (Signed)
ANTIBIOTIC CONSULT NOTE - INITIAL  Pharmacy Consult for Cefepime Indication: rule out sepsis  No Known Allergies  Patient Measurements:   From Dec:  Ht: 67 in   Wt: 93 kg  Vital Signs: Temp: 102.7 F (39.3 C) (04/10 2150) Temp src: Rectal (04/10 2150) BP: 122/78 mmHg (04/10 2200) Pulse Rate: 114 (04/10 2200) Intake/Output from previous day:   Intake/Output from this shift:    Labs:  Recent Labs  11/13/2013 2100 10/29/2013 2135  WBC 14.4*  --   HGB 12.6* 13.9  PLT 247  --   CREATININE 0.68 0.90   The CrCl is unknown because both a height and weight (above a minimum accepted value) are required for this calculation. No results found for this basename: VANCOTROUGH, VANCOPEAK, VANCORANDOM, GENTTROUGH, GENTPEAK, GENTRANDOM, TOBRATROUGH, TOBRAPEAK, TOBRARND, AMIKACINPEAK, AMIKACINTROU, AMIKACIN,  in the last 72 hours   Microbiology: No results found for this or any previous visit (from the past 720 hour(s)).  Medical History: Past Medical History  Diagnosis Date  . Varicose vein of leg     Medications:  See electronic med rec  Assessment: 80 y.o. male presents with SOB, R-sided weakness. Code stroke called. To begin broad spectrum antibiotics for sepsis. ED MD ordered Vancomycin x 1 dose and Cefepime per pharmacy.  Goal of Therapy:  Resolution of infection  Plan:  1. Cefepime 1gm IV q8h. 2. Will f/u admit orders for further Vancomycin orders/consult 3. Will f/u micro data, pt's clinical condition, renal function  Christoper Fabianaron Rubert Frediani, PharmD, BCPS Clinical pharmacist, pager [phone] 11/25/2013,10:19 PM

## 2013-11-06 NOTE — ED Notes (Signed)
Pt's wife at the bedside. States that pt has been on Xyralto since 09/15/13.

## 2013-11-06 NOTE — Progress Notes (Signed)
Patient transported to and from CT on BIPAP with no complications.

## 2013-11-06 NOTE — ED Notes (Signed)
BiPap taken off at 2200, put on [place]. Pt's RR increased to upper 40s. Pt placed back on BiPap 40% at 2220. RR rate in the low 30s.

## 2013-11-07 ENCOUNTER — Encounter (HOSPITAL_COMMUNITY): Payer: Self-pay | Admitting: *Deleted

## 2013-11-07 ENCOUNTER — Inpatient Hospital Stay (HOSPITAL_COMMUNITY): Payer: Medicare Other

## 2013-11-07 DIAGNOSIS — I5032 Chronic diastolic (congestive) heart failure: Secondary | ICD-10-CM | POA: Diagnosis present

## 2013-11-07 DIAGNOSIS — I82409 Acute embolism and thrombosis of unspecified deep veins of unspecified lower extremity: Secondary | ICD-10-CM

## 2013-11-07 DIAGNOSIS — R4182 Altered mental status, unspecified: Secondary | ICD-10-CM | POA: Diagnosis present

## 2013-11-07 DIAGNOSIS — I6340 Cerebral infarction due to embolism of unspecified cerebral artery: Secondary | ICD-10-CM | POA: Diagnosis present

## 2013-11-07 DIAGNOSIS — I509 Heart failure, unspecified: Secondary | ICD-10-CM

## 2013-11-07 DIAGNOSIS — J9600 Acute respiratory failure, unspecified whether with hypoxia or hypercapnia: Secondary | ICD-10-CM

## 2013-11-07 DIAGNOSIS — I059 Rheumatic mitral valve disease, unspecified: Secondary | ICD-10-CM

## 2013-11-07 DIAGNOSIS — I63239 Cerebral infarction due to unspecified occlusion or stenosis of unspecified carotid arteries: Secondary | ICD-10-CM

## 2013-11-07 DIAGNOSIS — I5030 Unspecified diastolic (congestive) heart failure: Secondary | ICD-10-CM

## 2013-11-07 DIAGNOSIS — M6281 Muscle weakness (generalized): Secondary | ICD-10-CM

## 2013-11-07 DIAGNOSIS — J209 Acute bronchitis, unspecified: Secondary | ICD-10-CM | POA: Insufficient documentation

## 2013-11-07 DIAGNOSIS — R7881 Bacteremia: Secondary | ICD-10-CM

## 2013-11-07 DIAGNOSIS — A419 Sepsis, unspecified organism: Secondary | ICD-10-CM | POA: Diagnosis present

## 2013-11-07 LAB — BLOOD GAS, ARTERIAL
Acid-base deficit: 0.8 mmol/L (ref 0.0–2.0)
BICARBONATE: 23 meq/L (ref 20.0–24.0)
Delivery systems: POSITIVE
Drawn by: 312761
EXPIRATORY PAP: 5
FIO2: 0.4 %
Inspiratory PAP: 10
MODE: POSITIVE
O2 Saturation: 98.5 %
PH ART: 7.425 (ref 7.350–7.450)
Patient temperature: 98.6
TCO2: 24.1 mmol/L (ref 0–100)
pCO2 arterial: 35.7 mmHg (ref 35.0–45.0)
pO2, Arterial: 131 mmHg — ABNORMAL HIGH (ref 80.0–100.0)

## 2013-11-07 LAB — TROPONIN I
TROPONIN I: 0.43 ng/mL — AB (ref <0.30)
Troponin I: <0.3 ng/mL (ref <0.30)

## 2013-11-07 LAB — COMPREHENSIVE METABOLIC PANEL
ALBUMIN: 2.6 g/dL — AB (ref 3.5–5.2)
ALT: 14 U/L (ref 0–53)
AST: 24 U/L (ref 0–37)
Alkaline Phosphatase: 105 U/L (ref 39–117)
BUN: 18 mg/dL (ref 6–23)
CO2: 22 mEq/L (ref 19–32)
CREATININE: 0.59 mg/dL (ref 0.50–1.35)
Calcium: 8.7 mg/dL (ref 8.4–10.5)
Chloride: 99 mEq/L (ref 96–112)
GFR calc Af Amer: >90 mL/min (ref >90)
GFR calc non Af Amer: 90 mL/min — ABNORMAL LOW (ref >90)
Glucose, Bld: 110 mg/dL — ABNORMAL HIGH (ref 70–99)
Potassium: 3.7 mEq/L (ref 3.7–5.3)
Sodium: 136 mEq/L — ABNORMAL LOW (ref 137–147)
TOTAL PROTEIN: 6.2 g/dL (ref 6.0–8.3)
Total Bilirubin: 0.5 mg/dL (ref 0.3–1.2)

## 2013-11-07 LAB — APTT: aPTT: 40 seconds — ABNORMAL HIGH (ref 24–37)

## 2013-11-07 LAB — INFLUENZA PANEL BY PCR (TYPE A & B)
H1N1 flu by pcr: NOT DETECTED
INFLBPCR: NEGATIVE
Influenza A By PCR: NEGATIVE

## 2013-11-07 LAB — CBC
HCT: 31.4 % — ABNORMAL LOW (ref 39.0–52.0)
Hemoglobin: 10.6 g/dL — ABNORMAL LOW (ref 13.0–17.0)
MCH: 27.5 pg (ref 26.0–34.0)
MCHC: 33.8 g/dL (ref 30.0–36.0)
MCV: 81.3 fL (ref 78.0–100.0)
Platelets: 227 10*3/uL (ref 150–400)
RBC: 3.86 MIL/uL — ABNORMAL LOW (ref 4.22–5.81)
RDW: 14.5 % (ref 11.5–15.5)
WBC: 12.1 10*3/uL — ABNORMAL HIGH (ref 4.0–10.5)

## 2013-11-07 LAB — PRO B NATRIURETIC PEPTIDE: Pro B Natriuretic peptide (BNP): 244.9 pg/mL (ref 0–450)

## 2013-11-07 LAB — AMMONIA: AMMONIA: 32 umol/L (ref 11–60)

## 2013-11-07 LAB — PROTIME-INR
INR: 1.61 — ABNORMAL HIGH (ref 0.00–1.49)
Prothrombin Time: 18.7 seconds — ABNORMAL HIGH (ref 11.6–15.2)

## 2013-11-07 LAB — MRSA PCR SCREENING: MRSA by PCR: NEGATIVE

## 2013-11-07 LAB — TSH: TSH: 1.3 u[IU]/mL (ref 0.350–4.500)

## 2013-11-07 MED ORDER — CHLORHEXIDINE GLUCONATE 0.12 % MT SOLN
15.0000 mL | Freq: Two times a day (BID) | OROMUCOSAL | Status: DC
Start: 2013-11-07 — End: 2013-11-09
  Administered 2013-11-07 – 2013-11-09 (×6): 15 mL via OROMUCOSAL
  Filled 2013-11-07 (×8): qty 15

## 2013-11-07 MED ORDER — IPRATROPIUM-ALBUTEROL 0.5-2.5 (3) MG/3ML IN SOLN
3.0000 mL | Freq: Four times a day (QID) | RESPIRATORY_TRACT | Status: DC
  Administered 2013-11-07 (×2): 3 mL via RESPIRATORY_TRACT
  Filled 2013-11-07 (×3): qty 3

## 2013-11-07 MED ORDER — VANCOMYCIN HCL IN DEXTROSE 1-5 GM/200ML-% IV SOLN
1000.0000 mg | Freq: Two times a day (BID) | INTRAVENOUS | Status: DC
  Administered 2013-11-07 – 2013-11-09 (×5): 1000 mg via INTRAVENOUS
  Filled 2013-11-07 (×6): qty 200

## 2013-11-07 MED ORDER — CARVEDILOL 3.125 MG PO TABS
3.1250 mg | ORAL_TABLET | Freq: Two times a day (BID) | ORAL | Status: DC
  Administered 2013-11-09: 3.125 mg via ORAL
  Filled 2013-11-07 (×6): qty 1

## 2013-11-07 MED ORDER — IPRATROPIUM-ALBUTEROL 0.5-2.5 (3) MG/3ML IN SOLN
3.0000 mL | Freq: Three times a day (TID) | RESPIRATORY_TRACT | Status: DC
  Administered 2013-11-08 – 2013-11-09 (×5): 3 mL via RESPIRATORY_TRACT
  Filled 2013-11-07 (×6): qty 3

## 2013-11-07 MED ORDER — ASPIRIN 300 MG RE SUPP
300.0000 mg | Freq: Every day | RECTAL | Status: DC
  Administered 2013-11-07: 300 mg via RECTAL
  Filled 2013-11-07 (×2): qty 1

## 2013-11-07 MED ORDER — ENOXAPARIN SODIUM 40 MG/0.4ML ~~LOC~~ SOLN
40.0000 mg | SUBCUTANEOUS | Status: DC
  Administered 2013-11-07: 40 mg via SUBCUTANEOUS
  Filled 2013-11-07: qty 0.4

## 2013-11-07 MED ORDER — ALBUMIN HUMAN 5 % IV SOLN
12.5000 g | Freq: Once | INTRAVENOUS | Status: AC
  Administered 2013-11-07: 12.5 g via INTRAVENOUS
  Filled 2013-11-07: qty 250

## 2013-11-07 MED ORDER — IPRATROPIUM-ALBUTEROL 0.5-2.5 (3) MG/3ML IN SOLN: 3.0000 mL | RESPIRATORY_TRACT | Status: DC

## 2013-11-07 MED ORDER — SODIUM CHLORIDE 0.9 % IJ SOLN
3.0000 mL | Freq: Two times a day (BID) | INTRAMUSCULAR | Status: DC
  Administered 2013-11-07 – 2013-11-09 (×6): 3 mL via INTRAVENOUS

## 2013-11-07 MED ORDER — PIPERACILLIN-TAZOBACTAM 3.375 G IVPB
3.3750 g | Freq: Three times a day (TID) | INTRAVENOUS | Status: DC
  Administered 2013-11-07 – 2013-11-09 (×7): 3.375 g via INTRAVENOUS
  Filled 2013-11-07 (×10): qty 50

## 2013-11-07 MED ORDER — ONDANSETRON HCL 4 MG PO TABS: 4.0000 mg | ORAL_TABLET | Freq: Four times a day (QID) | ORAL | Status: DC | PRN

## 2013-11-07 MED ORDER — ONDANSETRON HCL 4 MG/2ML IJ SOLN: 4.0000 mg | Freq: Four times a day (QID) | INTRAMUSCULAR | Status: DC | PRN

## 2013-11-07 MED ORDER — RIVAROXABAN 20 MG PO TABS
20.0000 mg | ORAL_TABLET | Freq: Every day | ORAL | Status: DC
  Administered 2013-11-09: 20 mg via ORAL
  Filled 2013-11-07 (×2): qty 1

## 2013-11-07 MED ORDER — BIOTENE DRY MOUTH MT LIQD
15.0000 mL | Freq: Two times a day (BID) | OROMUCOSAL | Status: DC
  Administered 2013-11-07 – 2013-11-09 (×6): 15 mL via OROMUCOSAL

## 2013-11-07 MED ORDER — QUETIAPINE FUMARATE 25 MG PO TABS
25.0000 mg | ORAL_TABLET | Freq: Every day | ORAL | Status: DC
  Filled 2013-11-07 (×3): qty 1

## 2013-11-07 NOTE — Progress Notes (Signed)
ANTIBIOTIC CONSULT NOTE - INITIAL  Pharmacy Consult for vancomycin and zosyn  Indication: rule out sepsis  No Known Allergies  Patient Measurements: Height: 5\' 8"  (172.7 cm) Weight: 197 lb 12 oz (89.7 kg) IBW/kg (Calculated) : 68.4 Adjusted Body Weight:   Vital Signs: Temp: 97.3 F (36.3 C) (04/11 0117) Temp src: Axillary (04/11 0117) BP: 96/56 mmHg (04/11 0117) Pulse Rate: 96 (04/11 0117) Intake/Output from previous day:   Intake/Output from this shift:    Labs:  Recent Labs  05-Feb-2014 2100 05-Feb-2014 2135  WBC 14.4*  --   HGB 12.6* 13.9  PLT 247  --   CREATININE 0.68 0.90   Estimated Creatinine Clearance: 65.3 ml/min (by C-G formula based on Cr of 0.9). No results found for this basename: VANCOTROUGH, VANCOPEAK, VANCORANDOM, GENTTROUGH, GENTPEAK, GENTRANDOM, TOBRATROUGH, TOBRAPEAK, TOBRARND, AMIKACINPEAK, AMIKACINTROU, AMIKACIN,  in the last 72 hours   Microbiology: No results found for this or any previous visit (from the past 720 hour(s)).  Medical History: Past Medical History  Diagnosis Date  . Varicose vein of leg     Medications:  Prescriptions prior to admission  Medication Sig Dispense Refill  . Rivaroxaban (XARELTO PO) Take 1 tablet by mouth daily.      Marland Kitchen. tiZANidine (ZANAFLEX) 2 MG tablet Take 1 tablet by mouth 2 (two) times daily.      . mirtazapine (REMERON) 30 MG tablet Take 1 tablet by mouth every evening.       Assessment: 80 yo with initial adx for code stemi/stroke now admitted for sepsis. vanc 1500mg  received in ED at 2245 cefepime 1gm at 2300. To continue vancomycin at beging zosyn.   Goal of Therapy:  Vancomycin trough level 15-20 mcg/ml  Plan:  Continue with vancomycin 1gm q12h next dose at 1000. Begin zosyn 3.375 gm q8h at 0600 (covered with cefepime for now)   Janice CoffinWilliam Jonathan Eryk Beavers 11/07/2013,2:53 AM

## 2013-11-07 NOTE — Evaluation (Addendum)
Physical Therapy Evaluation Patient Details Name: Justin Mcdonald MRN: [id number] DOB: 22-Dec-1926 Today's Date: 11/07/2013   History of Present Illness  The patient is a 80 year old male who presented with complaints of altered mental status. Patient was recently [facility], at which time he was found to have DVT and from there he was sent to [facility] where he was discharged 3-4 weeks ago. Patient has been working with home physical therapy. 2 days ago he had a pulled muscle on the in his back on the right side because of which he has been having difficulty moving his right side. He also has been having poor appetite since last few days associated with generalized weakness and tiredness CT (-), PE (-), await MRI  Clinical Impression  Pt currently on bipap and initially lethargic without eyes open with poor interaction. Once EOB eyes open and pt attempting to communicate with head nods although not always clear between yes or no and not following commands for thumb up as well. Pt was at home with wife after recent ST-SNF stay and demonstrates right sided hemiparesis without noticeable movement or muscle contraction of RUE and limited movement of RLE currently. Discussed with wife over the phone who seems convinced current state is related to pt pulling a muscle in his back on the right side recently and lack of eating for a few days. Pt currently is a 2 person assist for bed mobility with pushing to right in sitting. Pt will benefit from acute therapy to maximize mobility, strength, function and balance to decrease burden of care and improve quality of life. Will follow acutely    Follow Up Recommendations CIR;Supervision/Assistance - 24 hour    Equipment Recommendations  None recommended by PT    Recommendations for Other Services OT consult;Speech consult;Rehab consult     Precautions / Restrictions Precautions Precautions: Fall Precaution Comments:  bipap      Mobility  Bed Mobility Overal bed mobility: Needs Assistance;+2 for physical assistance Bed Mobility: Rolling;Sidelying to Sit;Sit to Supine Rolling: Max assist;+2 for physical assistance Sidelying to sit: Total assist;+2 for physical assistance   Sit to supine: +2 for physical assistance;Max assist   General bed mobility comments: pt assisting with rolling with hand over hand assist to reach LUE to rail and max assist initially to bend left knee with facilitation at pelvis and trunk to roll. With side to sit total assist to elevate trunk and transfer and with return to supine pt with assist of gravity able to return trunk to surface with max assist to elevate legs to bed  Transfers                    Ambulation/Gait                Stairs            Wheelchair Mobility    Modified Rankin (Stroke Patients Only) Modified Rankin (Stroke Patients Only) Pre-Morbid Rankin Score: Moderate disability Modified Rankin: Severe disability     Balance Overall balance assessment: Needs assistance Sitting-balance support: Single extremity supported;Feet supported Sitting balance-Leahy Scale: Zero Sitting balance - Comments: without LUE support on rail pt with significant posterior right lean, with LUE on bed pt pushing to right and with pt LUE on foot rail pt able to grasp rail and assist with sitting with mod assist to maintain anterior weight shift Postural control: Posterior lean;Right lateral lean  Pertinent Vitals/Pain CPOT = 0 HR 93-106 with PVC bipap with sats 100%    Home Living Family/patient expects to be discharged to:: Private residence Living Arrangements: Spouse/significant other Available Help at Discharge: Family;Available 24 hours/day Type of Home: House Home Access: Level entry     Home Layout: One level Home Equipment: Walker - 2 wheels;Shower seat;Wheelchair - manual;Grab bars -  tub/shower;Cane - single point      Prior Function Level of Independence: Needs assistance   Gait / Transfers Assistance Needed: pt was walking with RW in home limited distance with wife supervision with WC for transport outside of the home  ADL's / Homemaking Assistance Needed: wife was assisting at times with dressing and supervising bathing but reports pt was doing the bath on his own  Comments: PLOF via wife wilma on the phone     Hand Dominance        Extremity/Trunk Assessment   Upper Extremity Assessment: RUE deficits/detail;LUE deficits/detail;Difficult to assess due to impaired cognition RUE Deficits / Details: pt without withdrawal to pain and no AROM throughout session     LUE Deficits / Details: pt able to move LUE but not following all commands to fully assess at least 2/5 grossly   Lower Extremity Assessment: RLE deficits/detail;LLE deficits/detail;Difficult to assess due to impaired cognition RLE Deficits / Details: no AROM and resistant with knee flexion on command, wiggles toes with touch to foot and toes but no other movement LLE Deficits / Details: grossly 2/5 pt unable to follow all commands for formal assessment     Communication   Communication: Other (comment) (pt on bipap and not attempting verbalization)  Cognition Arousal/Alertness: Lethargic Behavior During Therapy: Flat affect Overall Cognitive Status: Difficult to assess                      General Comments      Exercises General Exercises - Lower Extremity Ankle Circles/Pumps: AROM;Left;5 reps;Supine Heel Slides: AROM;Left;5 reps;Supine      Assessment/Plan    PT Assessment Patient needs continued PT services  PT Diagnosis Abnormality of gait;Hemiplegia dominant side;Altered mental status   PT Problem List Decreased strength;Decreased cognition;Decreased activity tolerance;Decreased balance;Decreased mobility;Cardiopulmonary status limiting activity  PT Treatment  Interventions Functional mobility training;Therapeutic activities;Therapeutic exercise;Patient/family education;Cognitive remediation;Neuromuscular re-education;Balance training   PT Goals (Current goals can be found in the Care Plan section) Acute Rehab PT Goals Patient Stated Goal: wife would like pt to be able to function to return home PT Goal Formulation: With family Time For Goal Achievement: 11/21/13 Potential to Achieve Goals: Fair    Frequency Min 4X/week   Barriers to discharge Decreased caregiver support      Co-evaluation               End of Session   Activity Tolerance: Patient limited by fatigue Patient left: in bed;with call bell/phone within reach;with bed alarm set Nurse Communication: Mobility status;Need for lift equipment         Time: [phone] PT Time Calculation (min): 16 min   Charges:   PT Evaluation $Initial PT Evaluation Tier I: 1 Procedure PT Treatments $Therapeutic Activity: 8-22 mins   PT G Codes:          Malarie Tappen B Dmario Russom 11/07/2013, 9:11 AM Delaney Meigs, PT [phone]

## 2013-11-07 NOTE — Consult Note (Addendum)
Referring Physician: Dr. Posey Pronto.    Chief Complaint: altered mental status, right hemiparesis, left gaze preference, slurred speech.  HPI:                                                                                                                                         Ashford Clouse is an 80 y.o. male with a past medical history significant for recently diagnosed DVT on xarelto, brought in due to the above stated symptoms. Patient is not able to provide reliable information and thus all history is obtained form the chart which indicated that he was recently admitted in February at [facility], at which time he was found to have DVT and from there he was sent to rehabilitation facility from where he was discharged 3-4 weeks ago. Patient has been working with home physical therapy.Today after returning from physical therapy the patient was found nonresponsive and confused and therefore they called EMS. Patient's EKG was read by EMS as ST elevation and therefore code STEMI was called. He was febrile with increase breathing and tachycardia and elevated white count upon arrival and there was also a concern for sepsis. Of note, patient's wife reported that couple of days ago he pulled muscle on the right side because of which he has been having difficulty moving his right side. Upon arrival to ED he had a CT brain that did not show acute intracranial abnormality.  Date last known well: 11/07/13 Time last known well: no completely clear, perhaps 1945pm   tPA Given: no, on xarelto NIHSS: 18 MRS: 3  Past Medical History  Diagnosis Date  . Varicose vein of leg     Past Surgical History  Procedure Laterality Date  . Fracture surgery  89    leg  . Hernia repair  1996    History reviewed. No pertinent family history. Social History:  reports that he has quit smoking. He does not have any smokeless tobacco history on file. He reports that he does not drink alcohol or use illicit  drugs.  Allergies: No Known Allergies  Medications:                                                                                                                           I have reviewed the patient's current medications.  ROS: unable to obtain due to mental status.  History obtained from chart review  Physical exam: lethargic but can be arouse easily, mild respiratory distress.Blood pressure 102/60, pulse 82, temperature 98.2 F (36.8 C), temperature source Axillary, resp. rate 30, height _0  (1.727 m), weight 89.7 kg (197 lb 12 oz), SpO2 100.00%.  Head: normocephalic. Neck: supple, no bruits, no JVD. Cardiac: no murmurs. Lungs: clear. Abdomen: soft, no tender, no mass. Extremities: mild bilateral LE edema. CV: pulses palpable throughout  Neurologic Examination:                                                                                                      Mental Status: Open eyes to verbal commands but is not following commands. No spontaneous language, mumbles. Follows simple commands but inconsistently. Cranial Nerves: II: Discs flat bilaterally; Visual fields grossly normal, pupils equal, round, reactive to light and accommodation III,IV, VI: ptosis not present, extra-ocular motions intact bilaterally V,VII: smile symmetric, facial light touch sensation normal bilaterally VIII: hearing normal bilaterally IX,X: gag reflex present XI: bilateral shoulder shrug XII: midline tongue extension without atrophy or fasciculations Motor: Doesn't move the right side Tone and bulk:normal tone throughout; no atrophy noted Sensory: Pinprick and light touch intact throughout, bilaterally Deep Tendon Reflexes:  2 all over Plantars: Right: upgoing   Left: downgoing Cerebellar: Unable to test. Gait:  Unable to test     Results for orders placed  during the hospital encounter of 11/13/2013 (from the past 48 hour(s))  ETHANOL     Status: None   Collection Time    11/25/2013  9:00 PM      Result Value Ref Range   Alcohol, Ethyl (B) <11  0 - 11 mg/dL   Comment:            LOWEST DETECTABLE LIMIT FOR     SERUM ALCOHOL IS 11 mg/dL     FOR MEDICAL PURPOSES ONLY  PROTIME-INR     Status: Abnormal   Collection Time    11/22/2013  9:00 PM      Result Value Ref Range   Prothrombin Time 20.4 (*) 11.6 - 15.2 seconds   INR 1.80 (*) 0.00 - 1.49  APTT     Status: None   Collection Time    11/08/2013  9:00 PM      Result Value Ref Range   aPTT 35  24 - 37 seconds  COMPREHENSIVE METABOLIC PANEL     Status: Abnormal   Collection Time    11/05/2013  9:00 PM      Result Value Ref Range   Sodium 134 (*) 137 - 147 mEq/L   Potassium 4.2  3.7 - 5.3 mEq/L   Chloride 94 (*) 96 - 112 mEq/L   CO2 21  19 - 32 mEq/L   Glucose, Bld 115 (*) 70 - 99 mg/dL   BUN 19  6 - 23 mg/dL   Creatinine, Ser 0.68  0.50 - 1.35 mg/dL   Calcium 9.3  8.4 - 10.5 mg/dL   Total Protein 7.4  6.0 - 8.3 g/dL   Albumin 2.9 (*) 3.5 - 5.2  g/dL   AST 29  0 - 37 U/L   ALT 18  0 - 53 U/L   Alkaline Phosphatase 123 (*) 39 - 117 U/L   Total Bilirubin 0.7  0.3 - 1.2 mg/dL   GFR calc non Af Amer 85 (*) >90 mL/min   GFR calc Af Amer >90  >90 mL/min   Comment: (NOTE)     The eGFR has been calculated using the CKD EPI equation.     This calculation has not been validated in all clinical situations.     eGFR's persistently <90 mL/min signify possible Chronic Kidney     Disease.  TROPONIN I     Status: None   Collection Time    11/01/2013  9:00 PM      Result Value Ref Range   Troponin I <0.30  <0.30 ng/mL   Comment:            Due to the release kinetics of cTnI,     a negative result within the first hours     of the onset of symptoms does not rule out     myocardial infarction with certainty.     If myocardial infarction is still suspected,     repeat the test at appropriate  intervals.  CBC WITH DIFFERENTIAL     Status: Abnormal   Collection Time    11/22/2013  9:00 PM      Result Value Ref Range   WBC 14.4 (*) 4.0 - 10.5 K/uL   RBC 4.54  4.22 - 5.81 MIL/uL   Hemoglobin 12.6 (*) 13.0 - 17.0 g/dL   HCT 37.5 (*) 39.0 - 52.0 %   MCV 82.6  78.0 - 100.0 fL   MCH 27.8  26.0 - 34.0 pg   MCHC 33.6  30.0 - 36.0 g/dL   RDW 14.5  11.5 - 15.5 %   Platelets 247  150 - 400 K/uL   Neutrophils Relative % 82 (*) 43 - 77 %   Neutro Abs 11.7 (*) 1.7 - 7.7 K/uL   Lymphocytes Relative 11 (*) 12 - 46 %   Lymphs Abs 1.6  0.7 - 4.0 K/uL   Monocytes Relative 7  3 - 12 %   Monocytes Absolute 1.0  0.1 - 1.0 K/uL   Eosinophils Relative 0  0 - 5 %   Eosinophils Absolute 0.0  0.0 - 0.7 K/uL   Basophils Relative 0  0 - 1 %   Basophils Absolute 0.0  0.0 - 0.1 K/uL  CBG MONITORING, ED     Status: Abnormal   Collection Time    11/07/2013  9:27 PM      Result Value Ref Range   Glucose-Capillary 115 (*) 70 - 99 mg/dL  I-STAT TROPOININ, ED     Status: None   Collection Time    11/05/2013  9:33 PM      Result Value Ref Range   Troponin i, poc 0.01  0.00 - 0.08 ng/mL   Comment 3            Comment: Due to the release kinetics of cTnI,     a negative result within the first hours     of the onset of symptoms does not rule out     myocardial infarction with certainty.     If myocardial infarction is still suspected,     repeat the test at appropriate intervals.  I-STAT CHEM 8, ED     Status: Abnormal  Collection Time    10/31/2013  9:35 PM      Result Value Ref Range   Sodium 132 (*) 137 - 147 mEq/L   Potassium 4.0  3.7 - 5.3 mEq/L   Chloride 99  96 - 112 mEq/L   BUN 18  6 - 23 mg/dL   Creatinine, Ser 0.90  0.50 - 1.35 mg/dL   Glucose, Bld 121 (*) 70 - 99 mg/dL   Calcium, Ion 1.12 (*) 1.13 - 1.30 mmol/L   TCO2 23  0 - 100 mmol/L   Hemoglobin 13.9  13.0 - 17.0 g/dL   HCT 41.0  39.0 - 52.0 %  URINE RAPID DRUG SCREEN (HOSP PERFORMED)     Status: None   Collection Time    11/26/2013  10:03 PM      Result Value Ref Range   Opiates NONE DETECTED  NONE DETECTED   Cocaine NONE DETECTED  NONE DETECTED   Benzodiazepines NONE DETECTED  NONE DETECTED   Amphetamines NONE DETECTED  NONE DETECTED   Tetrahydrocannabinol NONE DETECTED  NONE DETECTED   Barbiturates NONE DETECTED  NONE DETECTED   Comment:            DRUG SCREEN FOR MEDICAL PURPOSES     ONLY.  IF CONFIRMATION IS NEEDED     FOR ANY PURPOSE, NOTIFY LAB     WITHIN 5 DAYS.                LOWEST DETECTABLE LIMITS     FOR URINE DRUG SCREEN     Drug Class       Cutoff (ng/mL)     Amphetamine      1000     Barbiturate      200     Benzodiazepine   768     Tricyclics       115     Opiates          300     Cocaine          300     THC              50  URINALYSIS, ROUTINE W REFLEX MICROSCOPIC     Status: Abnormal   Collection Time    11/06/2013 10:03 PM      Result Value Ref Range   Color, Urine AMBER (*) YELLOW   Comment: BIOCHEMICALS MAY BE AFFECTED BY COLOR   APPearance CLEAR  CLEAR   Specific Gravity, Urine 1.037 (*) 1.005 - 1.030   pH 6.0  5.0 - 8.0   Glucose, UA NEGATIVE  NEGATIVE mg/dL   Hgb urine dipstick NEGATIVE  NEGATIVE   Bilirubin Urine SMALL (*) NEGATIVE   Ketones, ur 15 (*) NEGATIVE mg/dL   Protein, ur NEGATIVE  NEGATIVE mg/dL   Urobilinogen, UA 1.0  0.0 - 1.0 mg/dL   Nitrite NEGATIVE  NEGATIVE   Leukocytes, UA NEGATIVE  NEGATIVE   Comment: MICROSCOPIC NOT DONE ON URINES WITH NEGATIVE PROTEIN, BLOOD, LEUKOCYTES, NITRITE, OR GLUCOSE <1000 mg/dL.  I-STAT CG4 LACTIC ACID, ED     Status: Abnormal   Collection Time    11/05/2013 10:26 PM      Result Value Ref Range   Lactic Acid, Venous 2.21 (*) 0.5 - 2.2 mmol/L  I-STAT ARTERIAL BLOOD GAS, ED     Status: Abnormal   Collection Time    11/01/2013 11:38 PM      Result Value Ref Range   pH, Arterial 7.418  7.350 -  7.450   pCO2 arterial 33.5 (*) 35.0 - 45.0 mmHg   pO2, Arterial 116.0 (*) 80.0 - 100.0 mmHg   Bicarbonate 21.6  20.0 - 24.0 mEq/L   TCO2 23   0 - 100 mmol/L   O2 Saturation 99.0     Acid-base deficit 2.0  0.0 - 2.0 mmol/L   Collection site RADIAL, ALLEN'S TEST ACCEPTABLE     Drawn by RT     Sample type ARTERIAL    MRSA PCR SCREENING     Status: None   Collection Time    11/07/13  1:49 AM      Result Value Ref Range   MRSA by PCR NEGATIVE  NEGATIVE   Comment:            The GeneXpert MRSA Assay (FDA     approved for NASAL specimens     only), is one component of a     comprehensive MRSA colonization     surveillance program. It is not     intended to diagnose MRSA     infection nor to guide or     monitor treatment for     MRSA infections.   Ct Head Wo Contrast  10/30/2013   CLINICAL DATA:  Altered mental status.  EXAM: CT HEAD WITHOUT CONTRAST  TECHNIQUE: Contiguous axial images were obtained from the base of the skull through the vertex without intravenous contrast.  COMPARISON:  09/16/2013  FINDINGS: Technically limited study due to motion artifact. Diffuse cerebral atrophy. Ventricular dilatation consistent with central atrophy. Low-attenuation changes throughout the deep white matter consistent with small vessel ischemia. No abnormal extra-axial fluid collections. No mass effect or midline shift. Basal cisterns are not effaced. No evidence of acute intracranial hemorrhage. Prominent dilatation of the basilar artery. Vascular calcifications. Calvarium appears intact. Visualized paranasal sinuses and mastoid air cells are not opacified. No change in appearance of intracranial contents since previous study.  IMPRESSION: No acute intracranial abnormalities. Chronic atrophy and small vessel ischemic changes.   Electronically Signed   By: Lucienne Capers M.D.   On: 11/02/2013 21:50   Ct Angio Chest Pe W/cm &/or Wo Cm  10/31/2013   CLINICAL DATA:  Followup varicose veins. Leg swelling. History of previous DVTs. Tachycardia.  EXAM: CT ANGIOGRAPHY CHEST WITH CONTRAST  TECHNIQUE: Multidetector CT imaging of the chest was performed  using the standard protocol during bolus administration of intravenous contrast. Multiplanar CT image reconstructions and MIPs were obtained to evaluate the vascular anatomy.  CONTRAST:  100 mL Omnipaque 350  COMPARISON:  08/1913  FINDINGS: Technically adequate study with good opacification of central and segmental pulmonary arteries. No focal filling defects. No evidence of significant pulmonary embolus.  Normal heart size. Calcification in the coronary arteries. Calcification in the aorta without aneurysm. No significant lymphadenopathy in the chest. Visualization of the lungs is limited due to respiratory motion artifact but there appears to be atelectasis in the lung bases, increasing since the previous study. No pneumothorax. No pleural effusions. Airways appear patent. Pulmonary nodules seen previously in the right lower lung and right upper lung are unchanged. Visualized portions of the upper abdominal organs demonstrate apparent fatty infiltration of the liver. Degenerative changes in the spine. No destructive bone lesions appreciated.  Review of the MIP images confirms the above findings.  IMPRESSION: No evidence of significant pulmonary embolus. Stable appearance of right pulmonary nodules. As previously suggested, followup in 6-12 months is recommended.   Electronically Signed   By: Oren Beckmann.D.  On: 11/23/2013 22:08   Dg Chest Port 1 View  11/01/2013   CLINICAL DATA:  Code Sepsis  EXAM: PORTABLE CHEST - 1 VIEW  COMPARISON:  None prior CT from from the same day.  FINDINGS: The cardiac and mediastinal silhouettes are stable in size and contour, and remain within normal limits.  The lungs are mildly hypoinflated. . No airspace consolidation, pleural effusion, or pulmonary edema is identified. There is no pneumothorax. Known subcentimeter pulmonary nodules not well visualized.  No acute osseous abnormality identified. Remotely healed left-sided rib fractures noted.  IMPRESSION: Shallow lung  inflation.  No acute cardiopulmonary abnormality.   Electronically Signed   By: Jeannine Boga M.D.   On: 11/16/2013 22:24    Assessment: 80 y.o. male admitted to [facility] due to sepsis, altered mental status with right HP. NIHSS 18, CT brain negative for acute abnormality. Probable left MCA distribution infarct. On xarelto and thus no a candidate for IV thrombolysis. Last known well was not completely clear and thus did not consider him for potential endovascular therapy. Will complete stroke work up. Continue xarelto. Stroke team will resume care tomorrow morning.   Stroke Risk Factors - age  Plan: 1. HgbA1c, fasting lipid panel 2. MRI, MRA  of the brain without contrast 3. Echocardiogram 4. Carotid dopplers 5. Prophylactic therapy-xarelto 6. Risk factor modification 7. Telemetry monitoring 8. Frequent neuro checks 9. PT/OT SLP  Dorian Pod, MD [facility] [phone]  11/07/2013, 6:15 AM

## 2013-11-07 NOTE — Progress Notes (Signed)
Blood Culture results communicated to Dr Joseph ArtWoods via text page.

## 2013-11-07 NOTE — Progress Notes (Signed)
Dr Thad Rangereynolds made aware of MRI results

## 2013-11-07 NOTE — Progress Notes (Signed)
Stroke Team Progress Note  HISTORY  Justin Mcdonald is an 80 y.o. male with a past medical history significant for recently diagnosed DVT on xarelto, brought in due to the above stated symptoms.  Patient is not able to provide reliable information and thus all history is obtained form the chart which indicated that he was recently admitted in February at [facility], at which time he was found to have DVT and from there he was sent to rehabilitation facility from where he was discharged 3-4 weeks ago. Patient has been working with home physical therapy.Today after returning from physical therapy the patient was found nonresponsive and confused and therefore they called EMS. Patient's EKG was read by EMS as ST elevation and therefore code STEMI was called.  He was febrile with increase breathing and tachycardia and elevated white count upon arrival and there was also a concern for sepsis.  Of note, patient's wife reported that couple of days ago he pulled muscle on the right side because of which he has been having difficulty moving his right side.  Upon arrival to ED he had a CT brain that did not show acute intracranial abnormality.  Date last known well: 11/07/13  Time last known well: no completely clear, perhaps 1945pm  tPA Given: no, on xarelto  NIHSS: 18  MRS: 3   SUBJECTIVE No family is at bedside. The patient is mute.  OBJECTIVE Most recent Vital Signs: Filed Vitals:   11/07/13 0821 11/07/13 0855 11/07/13 0900 11/07/13 1000  BP:   117/67 110/70  Pulse: 89 96 97 98  Temp:   98 F (36.7 C)   TempSrc:   Axillary   Resp:      Height:      Weight:      SpO2: 100% 100% 100% 100%   CBG (last 3)   Recent Labs  11/09/2013 2127  GLUCAP 115*    IV Fluid Intake:     MEDICATIONS  . antiseptic oral rinse  15 mL Mouth Rinse q12n4p  . chlorhexidine  15 mL Mouth Rinse BID  . ipratropium-albuterol  3 mL Nebulization Q6H  . piperacillin-tazobactam (ZOSYN)  IV  3.375 g  Intravenous 3 times per day  . sodium chloride  3 mL Intravenous Q12H  . vancomycin  1,000 mg Intravenous Q12H   PRN:  ondansetron (ZOFRAN) IV, ondansetron  Diet:  NPO  Activity:  Bedrest DVT Prophylaxis:  SCD  CLINICALLY SIGNIFICANT STUDIES Basic Metabolic Panel:  Recent Labs Lab 11/23/2013 2100 10/31/2013 2135 11/07/13 0912  NA 134* 132* 136*  K 4.2 4.0 3.7  CL 94* 99 99  CO2 21  --  22  GLUCOSE 115* 121* 110*  BUN 19 18 18   CREATININE 0.68 0.90 0.59  CALCIUM 9.3  --  8.7   Liver Function Tests:  Recent Labs Lab 11/18/2013 2100 11/07/13 0912  AST 29 24  ALT 18 14  ALKPHOS 123* 105  BILITOT 0.7 0.5  PROT 7.4 6.2  ALBUMIN 2.9* 2.6*   CBC:  Recent Labs Lab 11/17/2013 2100 10/29/2013 2135 11/07/13 0912  WBC 14.4*  --  12.1*  NEUTROABS 11.7*  --   --   HGB 12.6* 13.9 10.6*  HCT 37.5* 41.0 31.4*  MCV 82.6  --  81.3  PLT 247  --  227   Coagulation:  Recent Labs Lab 11/02/2013 2100 11/07/13 0912  LABPROT 20.4* 18.7*  INR 1.80* 1.61*   Cardiac Enzymes:  Recent Labs Lab 11/14/2013 2100 11/07/13 0912  TROPONINI <  0.30 0.43*   Urinalysis:  Recent Labs Lab 11/23/2013 2203  COLORURINE AMBER*  LABSPEC 1.037*  PHURINE 6.0  GLUCOSEU NEGATIVE  HGBUR NEGATIVE  BILIRUBINUR SMALL*  KETONESUR 15*  PROTEINUR NEGATIVE  UROBILINOGEN 1.0  NITRITE NEGATIVE  LEUKOCYTESUR NEGATIVE   Lipid Panel No results found for this basename: chol, trig, hdl, cholhdl, vldl, ldlcalc   HgbA1C  No results found for this basename: HGBA1C    Urine Drug Screen:     Component Value Date/Time   LABOPIA NONE DETECTED 11/09/2013 2203   COCAINSCRNUR NONE DETECTED 10/28/2013 2203   LABBENZ NONE DETECTED 11/16/2013 2203   AMPHETMU NONE DETECTED 11/21/2013 2203   THCU NONE DETECTED 11/07/2013 2203   LABBARB NONE DETECTED 11/09/2013 2203    Alcohol Level:  Recent Labs Lab 11/21/2013 2100  ETH <11    Ct Head Wo Contrast  11/20/2013   CLINICAL DATA:  Altered mental status.  EXAM: CT HEAD  WITHOUT CONTRAST  TECHNIQUE: Contiguous axial images were obtained from the base of the skull through the vertex without intravenous contrast.  COMPARISON:  09/16/2013  FINDINGS: Technically limited study due to motion artifact. Diffuse cerebral atrophy. Ventricular dilatation consistent with central atrophy. Low-attenuation changes throughout the deep white matter consistent with small vessel ischemia. No abnormal extra-axial fluid collections. No mass effect or midline shift. Basal cisterns are not effaced. No evidence of acute intracranial hemorrhage. Prominent dilatation of the basilar artery. Vascular calcifications. Calvarium appears intact. Visualized paranasal sinuses and mastoid air cells are not opacified. No change in appearance of intracranial contents since previous study.  IMPRESSION: No acute intracranial abnormalities. Chronic atrophy and small vessel ischemic changes.   Electronically Signed   By: Burman NievesWilliam  Stevens M.D.   On: 11/16/2013 21:50   Ct Angio Chest Pe W/cm &/or Wo Cm  11/16/2013   CLINICAL DATA:  Followup varicose veins. Leg swelling. History of previous DVTs. Tachycardia.  EXAM: CT ANGIOGRAPHY CHEST WITH CONTRAST  TECHNIQUE: Multidetector CT imaging of the chest was performed using the standard protocol during bolus administration of intravenous contrast. Multiplanar CT image reconstructions and MIPs were obtained to evaluate the vascular anatomy.  CONTRAST:  100 mL Omnipaque 350  COMPARISON:  08/1913  FINDINGS: Technically adequate study with good opacification of central and segmental pulmonary arteries. No focal filling defects. No evidence of significant pulmonary embolus.  Normal heart size. Calcification in the coronary arteries. Calcification in the aorta without aneurysm. No significant lymphadenopathy in the chest. Visualization of the lungs is limited due to respiratory motion artifact but there appears to be atelectasis in the lung bases, increasing since the previous study.  No pneumothorax. No pleural effusions. Airways appear patent. Pulmonary nodules seen previously in the right lower lung and right upper lung are unchanged. Visualized portions of the upper abdominal organs demonstrate apparent fatty infiltration of the liver. Degenerative changes in the spine. No destructive bone lesions appreciated.  Review of the MIP images confirms the above findings.  IMPRESSION: No evidence of significant pulmonary embolus. Stable appearance of right pulmonary nodules. As previously suggested, followup in 6-12 months is recommended.   Electronically Signed   By: Burman NievesWilliam  Stevens M.D.   On: 10/28/2013 22:08   Dg Chest Port 1 View  11/21/2013   CLINICAL DATA:  Code Sepsis  EXAM: PORTABLE CHEST - 1 VIEW  COMPARISON:  None prior CT from from the same day.  FINDINGS: The cardiac and mediastinal silhouettes are stable in size and contour, and remain within normal limits.  The lungs  are mildly hypoinflated. . No airspace consolidation, pleural effusion, or pulmonary edema is identified. There is no pneumothorax. Known subcentimeter pulmonary nodules not well visualized.  No acute osseous abnormality identified. Remotely healed left-sided rib fractures noted.  IMPRESSION: Shallow lung inflation.  No acute cardiopulmonary abnormality.   Electronically Signed   By: Rise Mu M.D.   On: 11/17/2013 22:24    CT of the brain   IMPRESSION:  No acute intracranial abnormalities. Chronic atrophy and small  vessel ischemic changes.   MRI of the brain    MRA of the brain    2D Echocardiogram    Carotid Doppler    CXR   IMPRESSION:  Shallow lung inflation. No acute cardiopulmonary abnormality.  EKG  Sinus tachycardia, HR of 121  Therapy Recommendations Pending  Physical Exam  General: The patient is alert and cooperative at the time of the examination.  Skin: No significant peripheral edema is noted.   Neurologic Exam  Mental status: The patient is mute, not following  pure verbal commands.  Cranial nerves: Facial symmetry is not present. There is a depression of the right NLF. The patient is mute, not following pure verbal commands, eyes are deviated to the left, no blink to threat from the right, will blink from the left.  Motor: The patient has good strength in left extremities.The patient does not move the right arm or leg.  Sensory examination: difficult to assess, seems to respond to pain on the left side, less well on the right side.  Coordination: The patient is unable to follow commands to perform cerebellar testing.  Gait and station: The gait was not tested.  Reflexes: Deep tendon reflexes are symmetric, but are depressed. There is a right babinski.    ASSESSMENT Justin Mcdonald is a 80 y.o. male presenting with a right hemiparesis and mutism. The patient likely has a large left MCA distribution CVA. He was on xarelto PTA for DVT.   Large left brain CVA  DVT on [facility] day # 1  TREATMENT/PLAN  Aspirin therapy for now, the patient is NPO  SQ lovenox  MRI brain  MRA  2D echo done, results pending  Carotid doppler  ST eval for swallow  PT and OT eval.  Stroke team will follow  Lipid panel is pending  Consider TEE given recent DVT and CVA R/O PFO  York Spaniel  11/07/2013 11:33 AM

## 2013-11-07 NOTE — Progress Notes (Signed)
  Echocardiogram 2D Echocardiogram has been performed.  Justin Mcdonald 11/07/2013, 9:46 AM

## 2013-11-07 NOTE — Progress Notes (Signed)
Just received pt from ED

## 2013-11-07 NOTE — Progress Notes (Signed)
CRITICAL VALUE ALERT  Critical value received: Trop .43  Date of notification:  11/07/13  Time of notification:  1045  Critical value read back:yes  Nurse who received alert:  Arman Bogusorothy Traver Meckes RN  MD notified (1st page):  Dr Joseph ArtWoods   Time of first page: 1140 informed while rounding

## 2013-11-07 NOTE — Progress Notes (Signed)
Patient transported to 2600 from ED with RN on BIPAP.  Patient tolerated well.  Report was given to receiving RT at bedside and machine was plugged into oxygen, air, and red outlet.

## 2013-11-07 NOTE — Progress Notes (Signed)
Justin McgregorMary Lynch NP made aware of Temp 101.4 axillary and O2 sats 88-92% on 4 L Pt is a mouth breather. New orders obtained Respiratory therapist in to place pt back on Bipap

## 2013-11-07 NOTE — Progress Notes (Signed)
ANTICOAGULATION CONSULT NOTE - Initial Consult  Pharmacy Consult for Xarelto Indication: h/o recurrent VTE  No Known Allergies  Patient Measurements: Height: 5\' 8"  (172.7 cm) Weight: 197 lb 12 oz (89.7 kg) IBW/kg (Calculated) : 68.4  Vital Signs: Temp: 98.2 F (36.8 C) (04/11 0417) Temp src: Axillary (04/11 0417) BP: 102/60 mmHg (04/11 0417) Pulse Rate: 82 (04/11 0417)  Labs:  Recent Labs  11/20/2013 2100 11/05/2013 2135  HGB 12.6* 13.9  HCT 37.5* 41.0  PLT 247  --   APTT 35  --   LABPROT 20.4*  --   INR 1.80*  --   CREATININE 0.68 0.90  TROPONINI <0.30  --     Estimated Creatinine Clearance: 65.3 ml/min (by C-G formula based on Cr of 0.9).   Medical History: Past Medical History  Diagnosis Date  . Varicose vein of leg     Medications:  Prescriptions prior to admission  Medication Sig Dispense Refill  . Rivaroxaban (XARELTO PO) Take 1 tablet by mouth daily.      Marland Kitchen. tiZANidine (ZANAFLEX) 2 MG tablet Take 1 tablet by mouth 2 (two) times daily.      . mirtazapine (REMERON) 30 MG tablet Take 1 tablet by mouth every evening.       Scheduled:  . antiseptic oral rinse  15 mL Mouth Rinse q12n4p  . chlorhexidine  15 mL Mouth Rinse BID  . ipratropium-albuterol  3 mL Nebulization Q6H  . piperacillin-tazobactam (ZOSYN)  IV  3.375 g Intravenous 3 times per day  . sodium chloride  3 mL Intravenous Q12H  . vancomycin  1,000 mg Intravenous Q12H    Assessment: 80yo male admitted for possible sepsis to continue Xarelto for h/o recurrent VTE; pt and wife are poor historians and cannot verbalize current dosing.   Plan:  Long-term anticoag with Xarelto should be 20mg  daily w/ food, but will f/u with pharmacy and/or prescribing MD for current dosing in case pt is still in initial phase.  Vernard GamblesVeronda Blanca Carreon, PharmD, BCPS  11/07/2013,4:22 AM

## 2013-11-07 NOTE — Progress Notes (Signed)
ANTICOAGULATION CONSULT NOTE - Initial Consult  Pharmacy Consult for Xarelto Indication: h/o recurrent VTE  No Known Allergies  Patient Measurements: Height: 5\' 8"  (172.7 cm) Weight: 197 lb 12 oz (89.7 kg) IBW/kg (Calculated) : 68.4  Vital Signs: Temp: 98.2 F (36.8 C) (04/11 1200) Temp src: Axillary (04/11 1200) BP: 112/67 mmHg (04/11 1400) Pulse Rate: 95 (04/11 1400)  Labs:  Recent Labs  11/08/2013 2100 11/02/2013 2135 11/07/13 0912  HGB 12.6* 13.9 10.6*  HCT 37.5* 41.0 31.4*  PLT 247  --  227  APTT 35  --  40*  LABPROT 20.4*  --  18.7*  INR 1.80*  --  1.61*  CREATININE 0.68 0.90 0.59  TROPONINI <0.30  --  0.43*    Estimated Creatinine Clearance: 73.4 ml/min (by C-G formula based on Cr of 0.59).   Medical History: Past Medical History  Diagnosis Date  . Varicose vein of leg     Medications:  Prescriptions prior to admission  Medication Sig Dispense Refill  . Rivaroxaban (XARELTO PO) Take 1 tablet by mouth daily.      Marland Kitchen. tiZANidine (ZANAFLEX) 2 MG tablet Take 1 tablet by mouth 2 (two) times daily.      . mirtazapine (REMERON) 30 MG tablet Take 1 tablet by mouth every evening.       Scheduled:  . antiseptic oral rinse  15 mL Mouth Rinse q12n4p  . aspirin  300 mg Rectal Daily  . chlorhexidine  15 mL Mouth Rinse BID  . ipratropium-albuterol  3 mL Nebulization Q6H  . piperacillin-tazobactam (ZOSYN)  IV  3.375 g Intravenous 3 times per day  . [START ON 11/08/2013] rivaroxaban  20 mg Oral Q supper  . sodium chloride  3 mL Intravenous Q12H  . vancomycin  1,000 mg Intravenous Q12H    Assessment: 80yo male admitted for possible sepsis to continue Xarelto for h/o recurrent VTE; pt and wife are poor historians and cannot verbalize current dosing. [facility] has script for 20mg  once daily, which was dropped off on 3/18, but never picked up. Will start 20mg  with evening meal - F/u with prescribing dr on Monday if still here. >>received one dose of Lovenox 40mg  today at  1300. Will D/C lovenox and enter order for Xarelto   Plan:  Xarelto 20mg  to start tomorrow evening  Thank you, Piedad ClimesKelley Miller, PharmD Clinical Pharmacist - Resident Pager: [phone] Pharmacy: [phone] 11/07/2013 4:12 PM

## 2013-11-07 NOTE — Progress Notes (Signed)
ICU/STEP DOWN [facility] PROGRESS NOTE  Justin Mcdonald ZOX:[id number] DOB: [birth year] DOA: 11/07/2013 PCP: Lyndon CodeKHAN, FOZIA M, MD       Principal Problem:   Sepsis Active Problems:   DVT (deep venous thrombosis)   Altered mental status   Right sided weakness   Acute bronchitis      VITAL SIGNS:  Temp: Pulse Rate:  Resp: BP: SpO2: FiO2 (%):   Ventilator settings  Mode  Rate  Tidal Volume  FiO2  PO2/ FIO2  PIP  Plateau      Assessment/Plan: Neuro Large left hemispheric infarct -Per neurology aspirin therapy 300 mg PR, -SLP for swallow eval -Ammonia, TSH, B12, folic acid pending  Resp Acute hypoxic respiratory failure -Patient came in on BiPAP but has been weaned down to 4 L O2 via [place] with SpO2> 92% -Respiratory to continue to ensure patient maintains SpO2> 92%   CVS Diastolic CHF  -Start patient on small dose Coreg 3.125mg  BID  -Troponin 1/3 positive, with the first and the last being negative. With the second troponin being the only positive troponin false positive vs demand ischemia possible -ProBNP= 244.9; no other level for comparison in Epic -Obtain a.m. EKG and troponin  GI   Renal balance today;        /overall;        Creatinine ;        Hourly output   -Renal function within normal limit   Endocrine -TSH pending  Extremeties Hx DVT -Continue Xarelto 20 mg  -[facility]  Heme/labs Anemia -Begin anemia workup  ID  Sepsis/Gram-positive cocci bacteremia -Continue Zosyn and vancomycin -Consult ID in the a.m. -Request TEE given the nature of patient's CVA ; septic emboli? -Patient may also require lumbar puncture   Code Status: Full Family Communication: None Disposition Plan: Resolution bacteremia    Devices   LINES / TUBES:        Consultants: Dr. Kenyon Anasvado Camilo (Neurology)     Procedures/SIGNIFICANT EVENTS: MRI/MRA 11/07/2013 Large left hemispheric infarct the as  detailed above.  Small infarct posterior right frontal operculum region.  Tiny infarct superior left cerebellum.  Findings raise possibility of embolic disease.  .  MRA HEAD :  Abrupt occlusion of the proximal M1 segment of the left middle  cerebral artery.  Findings suggestive of embolus with nonvisualized middle cerebral artery branches.    Echocardiogram 11/07/2013 - Left ventricle:  mild LVH.  Mild focal basal hypertrophy of the septum.  -LVEF= 60% to 65%.  -(grade 1 diastolic dysfunction). - Ascending aorta: The ascending aorta was mildly dilated. - Mitral valve: Mild regurgitation. - Left atrium: The atrium was moderately dilated. - Pericardium, extracardiac: A trivial pericardial effusion was identified.    CT angiogram chest PE protocol 11/13/2013 No evidence of significant pulmonary embolus.  Stable appearance of right pulmonary nodules. As previously suggested, followup in 6-12  months is recommended.     CULTURES:  11/07/2013 MRSA by PCR negative 11/14/2013 left forearm/right hand; blood culture positive for gram-positive cocci in pairs and chains    Antibiotics: Zosyn 4/11>> Vancomycin 4/11>>    Continuous Infusions:     HPI/Subjective: Justin Mcdonald is a 80 y.o. Male PMHx  Hx  DVT, varicose vein.  The patient is a 80 year old male who presented with complaints of altered mental status. The history was obtained from patient's wife on the phone as the patient is poor historian.  Patient was recently admitted in February at [facility], at which  time he was found to have DVT and from there he was sent to rehabilitation facility from where he was discharged 3-4 weeks ago. Patient has been working with home physical therapy. 2 days ago he had a pulled muscle on the right side because of which he has been having difficulty moving his right side. He also has been having poor appetite since last few days associated with generalized weakness and tiredness.   The wife denies any fever, chills, nausea, vomiting, diarrhea, shortness of breath, abdominal pain, bleeding.  They went to her PCP who placed her on Remeron and Zanaflex; patient has taken one dose of Zanaflex but no Remeron. Today after returning from physical therapy the patient was found nonresponsive and confused and therefore they called EMS.  Patient's EKG was read by EMS as ST elevation and therefore code STEMI and code stroke were called. But on arrival his EKG only showed right bundle branch block therefore code STEMI was canceled by ED. And wife mentioned that he has chronic right-sided weakness due to his muscle and therefore code stroke was canceled.  Critical care was consulted, who mentioned the patient would be appropriate under hospitalist service for stepdown.  The patient is coming from home. And at her baseline Independent for most of his ADL. Dr. Kenyon Ana (Neurology) evaluated patient at admission and ordered a stroke workup    Exam:   General:  A./O. x4, NAD  Cardiovascular: Regular rhythm and rate, negative murmurs rubs or gallops  Respiratory: Diffuse coarse breath sounds  Abdomen: Soft, nontender, nondistended, plus bowel  Musculoskeletal: Negative pedal edema  Neurologic Mental status awake and alert occasionally follows command, difficulty with speech, Cranial Nerves pupils are reactive extraocular muscle movement intact, Motor strength left more than right, limited movement of right, Sensation withdraws to pain bilaterally, reflexes present, babinski equivocal, Proprioception difficult to assess, Cerebellar test difficult to assess.       Data Reviewed: Basic Metabolic Panel:  Recent Labs Lab  2100 11/14/2013 2135 11/07/13 0912  NA 134* 132* 136*  K 4.2 4.0 3.7  CL 94* 99 99  CO2 21  --  22  GLUCOSE 115* 121* 110*  BUN 19 18 18   CREATININE 0.68 0.90 0.59  CALCIUM 9.3  --  8.7   Liver Function Tests:  Recent Labs Lab  11/10/2013 2100 11/07/13 0912  AST 29 24  ALT 18 14  ALKPHOS 123* 105  BILITOT 0.7 0.5  PROT 7.4 6.2  ALBUMIN 2.9* 2.6*   No results found for this basename: LIPASE, AMYLASE,  in the last 168 hours  Recent Labs Lab 11/07/13 0632  AMMONIA 32   CBC:  Recent Labs Lab 11/18/2013 2100 11/17/2013 2135 11/07/13 0912  WBC 14.4*  --  12.1*  NEUTROABS 11.7*  --   --   HGB 12.6* 13.9 10.6*  HCT 37.5* 41.0 31.4*  MCV 82.6  --  81.3  PLT 247  --  227   Cardiac Enzymes:  Recent Labs Lab 11/17/2013 2100 11/07/13 0912  TROPONINI <0.30 0.43*   BNP (last 3 results)  Recent Labs  11/07/13 0637  PROBNP 244.9   CBG:  Recent Labs Lab 11/20/2013 2127  GLUCAP 115*    Recent Results (from the past 240 hour(s))  MRSA PCR SCREENING     Status: None   Collection Time    11/07/13  1:49 AM      Result Value Ref Range Status   MRSA by PCR NEGATIVE  NEGATIVE Final  Comment:            The GeneXpert MRSA Assay (FDA     approved for NASAL specimens     only), is one component of a     comprehensive MRSA colonization     surveillance program. It is not     intended to diagnose MRSA     infection nor to guide or     monitor treatment for     MRSA infections.     Ct Head Wo Contrast  11/29/2013   CLINICAL DATA:  Altered mental status.  EXAM: CT HEAD WITHOUT CONTRAST  TECHNIQUE: Contiguous axial images were obtained from the base of the skull through the vertex without intravenous contrast.  COMPARISON:  09/16/2013  FINDINGS: Technically limited study due to motion artifact. Diffuse cerebral atrophy. Ventricular dilatation consistent with central atrophy. Low-attenuation changes throughout the deep white matter consistent with small vessel ischemia. No abnormal extra-axial fluid collections. No mass effect or midline shift. Basal cisterns are not effaced. No evidence of acute intracranial hemorrhage. Prominent dilatation of the basilar artery. Vascular calcifications. Calvarium appears  intact. Visualized paranasal sinuses and mastoid air cells are not opacified. No change in appearance of intracranial contents since previous study.  IMPRESSION: No acute intracranial abnormalities. Chronic atrophy and small vessel ischemic changes.   Electronically Signed   By: Burman Nieves M.D.   On: 11/29/13 21:50   Ct Angio Chest Pe W/cm &/or Wo Cm  Nov 29, 2013   CLINICAL DATA:  Followup varicose veins. Leg swelling. History of previous DVTs. Tachycardia.  EXAM: CT ANGIOGRAPHY CHEST WITH CONTRAST  TECHNIQUE: Multidetector CT imaging of the chest was performed using the standard protocol during bolus administration of intravenous contrast. Multiplanar CT image reconstructions and MIPs were obtained to evaluate the vascular anatomy.  CONTRAST:  100 mL Omnipaque 350  COMPARISON:  08/1913  FINDINGS: Technically adequate study with good opacification of central and segmental pulmonary arteries. No focal filling defects. No evidence of significant pulmonary embolus.  Normal heart size. Calcification in the coronary arteries. Calcification in the aorta without aneurysm. No significant lymphadenopathy in the chest. Visualization of the lungs is limited due to respiratory motion artifact but there appears to be atelectasis in the lung bases, increasing since the previous study. No pneumothorax. No pleural effusions. Airways appear patent. Pulmonary nodules seen previously in the right lower lung and right upper lung are unchanged. Visualized portions of the upper abdominal organs demonstrate apparent fatty infiltration of the liver. Degenerative changes in the spine. No destructive bone lesions appreciated.  Review of the MIP images confirms the above findings.  IMPRESSION: No evidence of significant pulmonary embolus. Stable appearance of right pulmonary nodules. As previously suggested, followup in 6-12 months is recommended.   Electronically Signed   By: Burman Nieves M.D.   On: 2013/11/29 22:08   Dg  Chest Port 1 View  11-29-2013   CLINICAL DATA:  Code Sepsis  EXAM: PORTABLE CHEST - 1 VIEW  COMPARISON:  None prior CT from from the same day.  FINDINGS: The cardiac and mediastinal silhouettes are stable in size and contour, and remain within normal limits.  The lungs are mildly hypoinflated. . No airspace consolidation, pleural effusion, or pulmonary edema is identified. There is no pneumothorax. Known subcentimeter pulmonary nodules not well visualized.  No acute osseous abnormality identified. Remotely healed left-sided rib fractures noted.  IMPRESSION: Shallow lung inflation.  No acute cardiopulmonary abnormality.   Electronically Signed   By: Janell Quiet.D.  On: 11/18/2013 22:24    Scheduled Meds: . antiseptic oral rinse  15 mL Mouth Rinse q12n4p  . aspirin  300 mg Rectal Daily  . chlorhexidine  15 mL Mouth Rinse BID  . enoxaparin (LOVENOX) injection  40 mg Subcutaneous Q24H  . ipratropium-albuterol  3 mL Nebulization Q6H  . piperacillin-tazobactam (ZOSYN)  IV  3.375 g Intravenous 3 times per day  . sodium chloride  3 mL Intravenous Q12H  . vancomycin  1,000 mg Intravenous Q12H        Time spent: 40 minute   Drema Dallas  [facility] Pager [phone]. If 7PM-7AM, please contact night-coverage at [email or web address], password [facility] 11/07/2013, 12:41 PM  LOS: 1 day

## 2013-11-07 NOTE — ED Provider Notes (Signed)
Medical screening examination/treatment/procedure(s) were conducted as a shared visit with non-physician practitioner(s) or resident and myself. I personally evaluated the patient during the encounter and agree with the findings and plan unless otherwise indicated.  I have personally reviewed any xrays and/ or EKG's with the provider and I agree with interpretation.  Patient presented to the EMS for code stroke and code STEMI. Patient has a history of blood clot as it is on Xarelto. Per report patient had generalized weakness worsening throughout the day and became confused, and a half prior to arrival. EMS found oxygen saturations in the 70s with minimal improvement on nasal cannula and patient improved on CPAP. Right-sided weakness noted today, unknown specific time of onset. On exam patient confused with minimal verbal response, left eye deviation, right arm flaccid paralysis and general weakness bilateral extremities., Patient is not following commands well. Patient has a few rales on lung exam with tachypnea and respiratory distress. Cardiology and neuro stroke team at bedside. With unknown onset time and patient being on blood thinner patient is not a candidate for acute TPA. Repeat EKG did not show any acute STEMI. Patient gradually improved in the ED on BiPAP. Patient is found to have a fever and sepsis workup initiated with broad-spectrum antibiotics. The plan for admission to step down.  CRITICAL CARE Performed by: Enid SkeensJoshua M Charisa Twitty  Total critical care time: 35 min  Critical care time was exclusive of separately billable procedures and treating other patients.  Critical care was necessary to treat or prevent imminent or life-threatening deterioration.  Critical care was time spent personally by me on the following activities: development of treatment plan with patient and/or surrogate as well as nursing, discussions with consultants, evaluation of patient's response to treatment, examination of  patient, obtaining history from patient or surrogate, ordering and performing treatments and interventions, ordering and review of laboratory studies, ordering and review of radiographic studies, pulse oximetry and re-evaluation of patient's condition.  Labs Reviewed   PROTIME-INR - Abnormal; Notable for the following:    Prothrombin Time  20.4 (*)     INR  1.80 (*)     All other components within normal limits   COMPREHENSIVE METABOLIC PANEL - Abnormal; Notable for the following:    Sodium  134 (*)     Chloride  94 (*)     Glucose, Bld  115 (*)     Albumin  2.9 (*)     Alkaline Phosphatase  123 (*)     GFR calc non Af Amer  85 (*)     All other components within normal limits   URINALYSIS, ROUTINE W REFLEX MICROSCOPIC - Abnormal; Notable for the following:    Color, Urine  AMBER (*)     Specific Gravity, Urine  1.037 (*)     Bilirubin Urine  SMALL (*)     Ketones, ur  15 (*)     All other components within normal limits   CBC WITH DIFFERENTIAL - Abnormal; Notable for the following:    WBC  14.4 (*)     Hemoglobin  12.6 (*)     HCT  37.5 (*)     Neutrophils Relative %  82 (*)     Neutro Abs  11.7 (*)     Lymphocytes Relative  11 (*)     All other components within normal limits   I-STAT CHEM 8, ED - Abnormal; Notable for the following:    Sodium  132 (*)  Glucose, Bld  121 (*)     Calcium, Ion  1.12 (*)     All other components within normal limits   CBG MONITORING, ED - Abnormal; Notable for the following:    Glucose-Capillary  115 (*)     All other components within normal limits   I-STAT CG4 LACTIC ACID, ED - Abnormal; Notable for the following:    Lactic Acid, Venous  2.21 (*)     All other components within normal limits   I-STAT ARTERIAL BLOOD GAS, ED - Abnormal; Notable for the following:    pCO2 arterial  33.5 (*)     pO2, Arterial  116.0 (*)     All other components within normal limits   CULTURE, BLOOD (ROUTINE X 2)   CULTURE, BLOOD (ROUTINE X 2)   URINE  CULTURE   GRAM STAIN   ETHANOL   APTT   URINE RAPID DRUG SCREEN (HOSP PERFORMED)   TROPONIN I   CBC   DIFFERENTIAL   PRO B NATRIURETIC PEPTIDE   BLOOD GAS, ARTERIAL   I-STAT TROPOININ, ED   I-STAT TROPOININ, ED    Sepsis, AMS, Right sided weakness   Enid Skeens, MD 11/07/13 0111

## 2013-11-07 NOTE — H&P (Signed)
[facility] History and Physical  Patient: Justin Mcdonald  ZOX:[id number]  DOB: 08/03/1926  DOS: the patient was seen and examined on 11/07/2013 PCP: Lyndon CodeKHAN, FOZIA M, MD  Chief Complaint: Altered mental status  HPI: Justin Mcdonald is a 80 y.o. male with Past medical history of DVT, varicose vein. The patient is a 80 year old male who presented with complaints of altered mental status. The history was obtained from patient's wife on the phone as the patient is poor historian. Patient was recently admitted in February at [facility], at which time he was found to have DVT and from there he was sent to rehabilitation facility from where he was discharged 3-4 weeks ago. Patient has been working with home physical therapy. 2 days ago he had a pulled muscle on the right side because of which he has been having difficulty moving his right side. He also has been having poor appetite since last few days associated with generalized weakness and tiredness. The wife denies any fever, chills, nausea, vomiting, diarrhea, shortness of breath, abdominal pain, bleeding. They went to her PCP who placed her on Remeron and Zanaflex; patient has taken one dose of Zanaflex but no Remeron. Today after returning from physical therapy the patient was found nonresponsive and confused and therefore they called EMS. Patient's EKG was read by EMS as ST elevation and therefore code STEMI and code stroke were called. But on arrival his EKG only showed right bundle branch block therefore code STEMI was canceled by ED. And wife mentioned that he has chronic right-sided weakness due to his muscle and therefore code stroke was canceled. Critical care was consulted, who mentioned the patient would be appropriate under hospitalist service for stepdown.  The patient is coming from home. And at her baseline Independent for most of his  ADL.  Review of Systems: as mentioned in the history of present illness.  A Comprehensive  review of the other systems is negative.  Past Medical History  Diagnosis Date  . Varicose vein of leg    Past Surgical History  Procedure Laterality Date  . Fracture surgery  294    leg  . Hernia repair  1996   Social History:  reports that he has quit smoking. He does not have any smokeless tobacco history on file. He reports that he does not drink alcohol or use illicit drugs.  No Known Allergies  No family history on file.  Prior to Admission medications   Medication Sig Start Date End Date Taking? Authorizing Provider  Rivaroxaban (XARELTO PO) Take 1 tablet by mouth daily.   Yes Historical Provider, MD  tiZANidine (ZANAFLEX) 2 MG tablet Take 1 tablet by mouth 2 (two) times daily. 11/05/13  Yes Historical Provider, MD  mirtazapine (REMERON) 30 MG tablet Take 1 tablet by mouth every evening. 11/05/13   Historical Provider, MD    Physical Exam: Filed Vitals:   11/05/2013 2330 11/26/2013 2340 11/14/2013 2350 11/07/13 0117  BP: 92/63 103/68 98/58 96/56   Pulse: 104 105 101 96  Temp:    97.3 F (36.3 C)  TempSrc:    Axillary  Resp: 32 31 30 30   Height:    5\' 8"  (1.727 m)  Weight:    89.7 kg (197 lb 12 oz)  SpO2: 95% 97% 96% 99%    General: Alert, Awake and  following command. Appear in mild distress Eyes: PERRL ENT: Oral Mucosa clear dry. Neck:  difficult to assess JVD Cardiovascular: S1 and S2 Present,  no Murmur, Peripheral  Pulses Present Respiratory: Bilateral Air entry equal and Decreased, bilateral coarse breath sound with possible rhonchi, no Crackles, no wheezes Abdomen: Bowel Sound Present, Soft and Non tender Skin:  no Rash Extremities:  no Pedal edema,  no calf tenderness Neurologic: Mental status awake and alert occasionally follows command, difficulty with speech, Cranial Nerves  pupils are reactive extraocular muscle movement intact, Motor strength  left more than right, limited movement of right, Sensation  withdraws to pain bilaterally, reflexes  present, babinski   equivocal, Proprioception  difficult to assess, Cerebellar test  difficult to assess.  Labs on Admission:  CBC:  Recent Labs Lab 11/01/2013 2100 11/05/2013 2135  WBC 14.4*  --   NEUTROABS 11.7*  --   HGB 12.6* 13.9  HCT 37.5* 41.0  MCV 82.6  --   PLT 247  --     CMP     Component Value Date/Time   NA 132* 11/22/2013 2135   K 4.0 11/07/2013 2135   CL 99 11/16/2013 2135   CO2 21 11/23/2013 2100   GLUCOSE 121* 11/05/2013 2135   BUN 18 10/30/2013 2135   CREATININE 0.90 11/01/2013 2135   CALCIUM 9.3 11/13/2013 2100   PROT 7.4 11/15/2013 2100   ALBUMIN 2.9* 11/07/2013 2100   AST 29 11/23/2013 2100   ALT 18 11/04/2013 2100   ALKPHOS 123* 11/08/2013 2100   BILITOT 0.7 11/20/2013 2100   GFRNONAA 85* 11/10/2013 2100   GFRAA >90 11/04/2013 2100    No results found for this basename: LIPASE, AMYLASE,  in the last 168 hours No results found for this basename: AMMONIA,  in the last 168 hours   Recent Labs Lab 11/15/2013 2100  TROPONINI <0.30   BNP (last 3 results) No results found for this basename: PROBNP,  in the last 8760 hours  Radiological Exams on Admission: Ct Head Wo Contrast  11/18/2013   CLINICAL DATA:  Altered mental status.  EXAM: CT HEAD WITHOUT CONTRAST  TECHNIQUE: Contiguous axial images were obtained from the base of the skull through the vertex without intravenous contrast.  COMPARISON:  09/16/2013  FINDINGS: Technically limited study due to motion artifact. Diffuse cerebral atrophy. Ventricular dilatation consistent with central atrophy. Low-attenuation changes throughout the deep white matter consistent with small vessel ischemia. No abnormal extra-axial fluid collections. No mass effect or midline shift. Basal cisterns are not effaced. No evidence of acute intracranial hemorrhage. Prominent dilatation of the basilar artery. Vascular calcifications. Calvarium appears intact. Visualized paranasal sinuses and mastoid air cells are not opacified. No change in appearance of  intracranial contents since previous study.  IMPRESSION: No acute intracranial abnormalities. Chronic atrophy and small vessel ischemic changes.   Electronically Signed   By: Burman Nieves M.D.   On: 11/05/2013 21:50   Ct Angio Chest Pe W/cm &/or Wo Cm  11/25/2013   CLINICAL DATA:  Followup varicose veins. Leg swelling. History of previous DVTs. Tachycardia.  EXAM: CT ANGIOGRAPHY CHEST WITH CONTRAST  TECHNIQUE: Multidetector CT imaging of the chest was performed using the standard protocol during bolus administration of intravenous contrast. Multiplanar CT image reconstructions and MIPs were obtained to evaluate the vascular anatomy.  CONTRAST:  100 mL Omnipaque 350  COMPARISON:  08/1913  FINDINGS: Technically adequate study with good opacification of central and segmental pulmonary arteries. No focal filling defects. No evidence of significant pulmonary embolus.  Normal heart size. Calcification in the coronary arteries. Calcification in the aorta without aneurysm. No significant lymphadenopathy in the chest. Visualization of the lungs is limited  due to respiratory motion artifact but there appears to be atelectasis in the lung bases, increasing since the previous study. No pneumothorax. No pleural effusions. Airways appear patent. Pulmonary nodules seen previously in the right lower lung and right upper lung are unchanged. Visualized portions of the upper abdominal organs demonstrate apparent fatty infiltration of the liver. Degenerative changes in the spine. No destructive bone lesions appreciated.  Review of the MIP images confirms the above findings.  IMPRESSION: No evidence of significant pulmonary embolus. Stable appearance of right pulmonary nodules. As previously suggested, followup in 6-12 months is recommended.   Electronically Signed   By: Burman Nieves M.D.   On:  22:08   Dg Chest Port 1 View  11/18/2013   CLINICAL DATA:  Code Sepsis  EXAM: PORTABLE CHEST - 1 VIEW  COMPARISON:   None prior CT from from the same day.  FINDINGS: The cardiac and mediastinal silhouettes are stable in size and contour, and remain within normal limits.  The lungs are mildly hypoinflated. . No airspace consolidation, pleural effusion, or pulmonary edema is identified. There is no pneumothorax. Known subcentimeter pulmonary nodules not well visualized.  No acute osseous abnormality identified. Remotely healed left-sided rib fractures noted.  IMPRESSION: Shallow lung inflation.  No acute cardiopulmonary abnormality.   Electronically Signed   By: Rise Mu M.D.   On:  22:24    EKG: Independently reviewed. there are no previous tracings available for comparison, RBBB.  Assessment/Plan Principal Problem:   Sepsis Active Problems:   DVT (deep venous thrombosis)   Altered mental status   Right sided weakness   Acute bronchitis   1. Sepsis possible acute hypoxic respiratory failure   the patient is presenting with complaints of altered mental status. He was found to have fever with tachycardia and acute hypoxic respiratory failure. He does her leukocytosis but does not have any significant abnormality on his CMP. Patient urine does not show any significant abnormality as well, he does not have any significant abnormality on abdomen on CT chest as well as on examination. With this the patient is admitted for possible sepsis most likely acute hypoxic respiratory failure secondary to bronchitis. At present I will continue him on broad-spectrum antibiotics due to his recent hospitalization with IV vancomycin and Zosyn, I will check influenza PCR and sputum culture urine antigens and urine culture for further workup. Blood culture is ordered and pending. Continue to monitor in stepdown  2.Recent DVT Continue rivaroxaban CT chest PE protocol is negative for pulmonary embolism  3. Altered mental status CT head is negative for any acute abnormality, neurology will be consulted  since the patient has speech difficulty as well as right-sided weakness and as per the wife the speech difficulty was not present. MRI brain Continues on rivaroxaban Possible acute toxic/metabolic encephalopathy, will check ammonia, TSH, B12, folic acid.  Consults:  neurology  DVT Prophylaxis: mechanical compression device Nutrition:  n.p.o.  Code Status:  full  Family Communication: discuss with wife on the phone  opportunity was given to ask question and all questions were answered satisfactorily at the time of interview. Disposition: Admitted to inpatient in step-down unit.  Author: Lynden Oxford, MD [facility] Pager: [phone] 11/07/2013, 2:21 AM    If 7PM-7AM, please contact night-coverage [email or web address] Password [email or web address]

## 2013-11-08 DIAGNOSIS — I6340 Cerebral infarction due to embolism of unspecified cerebral artery: Secondary | ICD-10-CM

## 2013-11-08 LAB — CBC WITH DIFFERENTIAL/PLATELET
BASOS PCT: 0 % (ref 0–1)
Basophils Absolute: 0 10*3/uL (ref 0.0–0.1)
Eosinophils Absolute: 0 10*3/uL (ref 0.0–0.7)
Eosinophils Relative: 0 % (ref 0–5)
HEMATOCRIT: 31 % — AB (ref 39.0–52.0)
HEMOGLOBIN: 10.4 g/dL — AB (ref 13.0–17.0)
LYMPHS ABS: 1 10*3/uL (ref 0.7–4.0)
LYMPHS PCT: 10 % — AB (ref 12–46)
MCH: 27.5 pg (ref 26.0–34.0)
MCHC: 33.5 g/dL (ref 30.0–36.0)
MCV: 82 fL (ref 78.0–100.0)
MONO ABS: 2.2 10*3/uL — AB (ref 0.1–1.0)
MONOS PCT: 22 % — AB (ref 3–12)
NEUTROS ABS: 6.7 10*3/uL (ref 1.7–7.7)
NEUTROS PCT: 68 % (ref 43–77)
Platelets: 251 10*3/uL (ref 150–400)
RBC: 3.78 MIL/uL — AB (ref 4.22–5.81)
RDW: 14.7 % (ref 11.5–15.5)
WBC: 9.8 10*3/uL (ref 4.0–10.5)

## 2013-11-08 LAB — LIPID PANEL
Cholesterol: 100 mg/dL (ref 0–200)
HDL: 14 mg/dL — ABNORMAL LOW (ref >39)
LDL Cholesterol: 68 mg/dL (ref 0–99)
TRIGLYCERIDES: 88 mg/dL (ref <150)
Total CHOL/HDL Ratio: 7.1 RATIO
VLDL: 18 mg/dL (ref 0–40)

## 2013-11-08 LAB — COMPREHENSIVE METABOLIC PANEL
ALBUMIN: 2.4 g/dL — AB (ref 3.5–5.2)
ALT: 14 U/L (ref 0–53)
AST: 27 U/L (ref 0–37)
Alkaline Phosphatase: 102 U/L (ref 39–117)
BILIRUBIN TOTAL: 0.5 mg/dL (ref 0.3–1.2)
BUN: 17 mg/dL (ref 6–23)
CHLORIDE: 103 meq/L (ref 96–112)
CO2: 23 meq/L (ref 19–32)
Calcium: 8.7 mg/dL (ref 8.4–10.5)
Creatinine, Ser: 0.56 mg/dL (ref 0.50–1.35)
GFR calc Af Amer: >90 mL/min (ref >90)
Glucose, Bld: 104 mg/dL — ABNORMAL HIGH (ref 70–99)
POTASSIUM: 3.4 meq/L — AB (ref 3.7–5.3)
Sodium: 139 mEq/L (ref 137–147)
Total Protein: 6.2 g/dL (ref 6.0–8.3)

## 2013-11-08 LAB — URINE CULTURE
COLONY COUNT: NO GROWTH
Culture: NO GROWTH

## 2013-11-08 LAB — MAGNESIUM: MAGNESIUM: 1.9 mg/dL (ref 1.5–2.5)

## 2013-11-08 LAB — LACTIC ACID, PLASMA: Lactic Acid, Venous: 1 mmol/L (ref 0.5–2.2)

## 2013-11-08 LAB — TROPONIN I: Troponin I: <0.3 ng/mL (ref <0.30)

## 2013-11-08 MED ORDER — ACETAMINOPHEN 650 MG RE SUPP
650.0000 mg | Freq: Once | RECTAL | Status: AC
  Administered 2013-11-08: 650 mg via RECTAL
  Filled 2013-11-08: qty 1

## 2013-11-08 NOTE — Progress Notes (Signed)
ICU/STEP DOWN [facility] PROGRESS NOTE  Justin Mcdonald ZOX:[id number] DOB: 1927-04-18 DOA: 10/29/2013 PCP: Lyndon CodeKHAN, FOZIA M, MD       Principal Problem:   Sepsis Active Problems:   DVT (deep venous thrombosis)   Altered mental status   Right sided weakness   Acute bronchitis   Diastolic CHF      VITAL SIGNS:  Temp: 36.8 Pulse Rate: 84 Resp: BP: 111/64 SpO2: 97% FiO2 (%): 40% on BiPAP   Ventilator settings  Mode  Rate  Tidal Volume  FiO2  PO2/ FIO2  PIP  Plateau      Assessment/Plan: Neuro Large left hemispheric infarct -4/12 Per neurology restart Xarelto per pharmacy -SLP for swallow eval pending -Ammonia, TSH, B12, folic acid pending -Carotid Doppler pending  Resp Acute hypoxic respiratory failure -4/12 Patient came in on BiPAP was weaned down to 4 L O2 via [place] with SpO2> 92% but today is back on BiPAP after dropping his SpO2 overnight -Respiratory to continue to ensure patient maintains SpO2> 92%   CVS Diastolic CHF  -Start patient on small dose Coreg 3.125mg  BID  - 4/12 Troponin 1/4 positive, with second troponin being the only positive troponin. Most likely false positive vs demand ischemia  -ProBNP= 244.9; no other level for comparison in Epic - 4/12 EKG; NSR, RBBB  GI   Renal balance today;        /overall;        Creatinine ;  0.56      Hourly output   -Renal function within normal limit   Endocrine -TSH pending  Extremeties Hx DVT -Continue Xarelto per pharmacy -Await[facility] from [facility]  Heme/labs Anemia -Begin anemia workup  ID  Sepsis/Gram-positive cocci bacteremia -Continue Zosyn and vancomycin -4/12 Consulted ID; per note agree with current plan  -Requested TEE given the nature of patient's CVA ; septic emboli? Patient currently not stable enough to endure TEE but will require one as soon as medically feasible.  -Patient may also require lumbar puncture   Code Status:  DNR  Family  Communication: 4/12 Spoke w/ Wife Mammie Russianlizabeth Hauck [phone], and requested phone updates as often as feasible secondary to her advanced age, and inability to travel to hospital Disposition Plan: Resolution bacteremia/Stroke    Devices   LINES / TUBES:        Consultants: Dr. Kenyon Anasvado Camilo (Neurology)     Procedures/SIGNIFICANT EVENTS: MRI/MRA 11/07/2013 Large left hemispheric infarct the as detailed above.  Small infarct posterior right frontal operculum region.  Tiny infarct superior left cerebellum.  Findings raise possibility of embolic disease.  .  MRA HEAD :  Abrupt occlusion of the proximal M1 segment of the left middle  cerebral artery.  Findings suggestive of embolus with nonvisualized middle cerebral artery branches.    Echocardiogram 11/07/2013 - Left ventricle:  mild LVH.  Mild focal basal hypertrophy of the septum.  -LVEF= 60% to 65%.  -(grade 1 diastolic dysfunction). - Ascending aorta: The ascending aorta was mildly dilated. - Mitral valve: Mild regurgitation. - Left atrium: The atrium was moderately dilated. - Pericardium, extracardiac: A trivial pericardial effusion was identified.    CT angiogram chest PE protocol 11/09/2013 No evidence of significant pulmonary embolus.  Stable appearance of right pulmonary nodules. As previously suggested, followup in 6-12  months is recommended.     CULTURES:  11/07/2013 MRSA by PCR negative 11/10/2013 left forearm/right hand; blood culture positive for gram-positive cocci in pairs and chains    Antibiotics: Zosyn 4/11>> Vancomycin 4/11>>  Continuous Infusions:     HPI/Subjective: Justin Mcdonald is a 80 y.o. WM PMHx  Hx  DVT, varicose vein,.  The patient is a 80 year old male who presented with complaints of altered mental status. The history was obtained from patient's wife on the phone as the patient is poor historian.  Patient was recently admitted in February at [facility], at  which time he was found to have DVT and from there he was sent to rehabilitation facility from where he was discharged 3-4 weeks ago. Patient has been working with home physical therapy. 2 days ago he had a pulled muscle on the right side because of which he has been having difficulty moving his right side. He also has been having poor appetite since last few days associated with generalized weakness and tiredness.  The wife denies any fever, chills, nausea, vomiting, diarrhea, shortness of breath, abdominal pain, bleeding.  They went to her PCP who placed her on Remeron and Zanaflex; patient has taken one dose of Zanaflex but no Remeron. Today after returning from physical therapy the patient was found nonresponsive and confused and therefore they called EMS.  Patient's EKG was read by EMS as ST elevation and therefore code STEMI and code stroke were called. But on arrival his EKG only showed right bundle branch block therefore code STEMI was canceled by ED. And wife mentioned that he has chronic right-sided weakness due to his muscle and therefore code stroke was canceled.  Critical care was consulted, who mentioned the patient would be appropriate under hospitalist service for stepdown.  The patient is coming from home. And at her baseline Independent for most of his ADL. Dr. Kenyon Ana (Neurology) evaluated patient at admission and ordered a stroke workup    Exam:   General:  A./O. x4, NAD  Cardiovascular: Regular rhythm and rate, negative murmurs rubs or gallops  Respiratory: Diffuse coarse breath sounds  Abdomen: Soft, nontender, nondistended, plus bowel  Musculoskeletal: Negative pedal edema  Neurologic Mental status awake and alert occasionally follows command, difficulty with speech, Cranial Nerves pupils are reactive extraocular muscle movement intact, Motor strength left more than right, limited movement of right, Sensation withdraws to pain bilaterally, reflexes present,  babinski equivocal, Proprioception difficult to assess, Cerebellar test difficult to assess.       Data Reviewed: Basic Metabolic Panel:  Recent Labs Lab 11/01/2013 2100 11/07/2013 2135 11/07/13 0912 11/08/13 0415  NA 134* 132* 136* 139  K 4.2 4.0 3.7 3.4*  CL 94* 99 99 103  CO2 21  --  22 23  GLUCOSE 115* 121* 110* 104*  BUN 19 18 18 17   CREATININE 0.68 0.90 0.59 0.56  CALCIUM 9.3  --  8.7 8.7  MG  --   --   --  1.9   Liver Function Tests:  Recent Labs Lab 11/02/2013 2100 11/07/13 0912 11/08/13 0415  AST 29 24 27   ALT 18 14 14   ALKPHOS 123* 105 102  BILITOT 0.7 0.5 0.5  PROT 7.4 6.2 6.2  ALBUMIN 2.9* 2.6* 2.4*   No results found for this basename: LIPASE, AMYLASE,  in the last 168 hours  Recent Labs Lab 11/07/13 0632  AMMONIA 32   CBC:  Recent Labs Lab 11/25/2013 2100 11/16/2013 2135 11/07/13 0912 11/08/13 0415  WBC 14.4*  --  12.1* 9.8  NEUTROABS 11.7*  --   --  6.7  HGB 12.6* 13.9 10.6* 10.4*  HCT 37.5* 41.0 31.4* 31.0*  MCV 82.6  --  81.3 82.0  PLT 247  --  227 251   Cardiac Enzymes:  Recent Labs Lab 11/07/2013 2100 11/07/13 0912 11/07/13 1715 11/08/13 0415  TROPONINI <0.30 0.43* <0.30 <0.30   BNP (last 3 results)  Recent Labs  11/07/13 0637  PROBNP 244.9   CBG:  Recent Labs Lab 11/01/2013 2127  GLUCAP 115*    Recent Results (from the past 240 hour(s))  URINE CULTURE     Status: None   Collection Time    11/07/2013 10:03 PM      Result Value Ref Range Status   Specimen Description URINE, CATHETERIZED   Final   Special Requests NONE   Final   Culture  Setup Time     Final   Value: 10/30/2013 22:47     Performed at [facility] Count     Final   Value: NO GROWTH     Performed at [facility]   Culture     Final   Value: NO GROWTH     Performed at [facility]   Report Status 11/08/2013 FINAL   Final  CULTURE, BLOOD (ROUTINE X 2)     Status: None   Collection Time    11/23/2013 10:13 PM       Result Value Ref Range Status   Specimen Description BLOOD RIGHT HAND   Final   Special Requests BOTTLES DRAWN AEROBIC AND ANAEROBIC 4 CC   Final   Culture  Setup Time     Final   Value: 11/07/2013 03:16     Performed at [facility]   Culture     Final   Value: ENTEROCOCCUS SPECIES     Note: Gram Stain Report Called to,Read Back By and Verified With: DOROTHY [facility] 11/07/13 @ 4:32PM RUSCA.     Performed at [facility]   Report Status PENDING   Incomplete  CULTURE, BLOOD (ROUTINE X 2)     Status: None   Collection Time    11/08/2013 10:17 PM      Result Value Ref Range Status   Specimen Description BLOOD LEFT FOREARM   Final   Special Requests BOTTLES DRAWN AEROBIC ONLY 8 CC   Final   Culture  Setup Time     Final   Value: 11/07/2013 03:16     Performed at [facility]   Culture     Final   Value: ENTEROCOCCUS SPECIES     Note: Gram Stain Report Called to,Read Back By and Verified With: DOROTHY Burnice 11/07/13 @ 4:32PM BY RUSCA     Performed at [facility]   Report Status PENDING   Incomplete  MRSA PCR SCREENING     Status: None   Collection Time    11/07/13  1:49 AM      Result Value Ref Range Status   MRSA by PCR NEGATIVE  NEGATIVE Final   Comment:            The GeneXpert MRSA Assay (FDA     approved for NASAL specimens     only), is one component of a     comprehensive MRSA colonization     surveillance program. It is not     intended to diagnose MRSA     infection nor to guide or     monitor treatment for     MRSA infections.     Ct Head Wo Contrast  11/18/2013   CLINICAL DATA:  Altered mental status.  EXAM: CT  HEAD WITHOUT CONTRAST  TECHNIQUE: Contiguous axial images were obtained from the base of the skull through the vertex without intravenous contrast.  COMPARISON:  09/16/2013  FINDINGS: Technically limited study due to motion artifact. Diffuse cerebral atrophy. Ventricular dilatation consistent with central atrophy. Low-attenuation  changes throughout the deep white matter consistent with small vessel ischemia. No abnormal extra-axial fluid collections. No mass effect or midline shift. Basal cisterns are not effaced. No evidence of acute intracranial hemorrhage. Prominent dilatation of the basilar artery. Vascular calcifications. Calvarium appears intact. Visualized paranasal sinuses and mastoid air cells are not opacified. No change in appearance of intracranial contents since previous study.  IMPRESSION: No acute intracranial abnormalities. Chronic atrophy and small vessel ischemic changes.   Electronically Signed   By: Burman Nieves M.D.   On: 12/02/2013 21:50   Ct Angio Chest Pe W/cm &/or Wo Cm  12-02-13   CLINICAL DATA:  Followup varicose veins. Leg swelling. History of previous DVTs. Tachycardia.  EXAM: CT ANGIOGRAPHY CHEST WITH CONTRAST  TECHNIQUE: Multidetector CT imaging of the chest was performed using the standard protocol during bolus administration of intravenous contrast. Multiplanar CT image reconstructions and MIPs were obtained to evaluate the vascular anatomy.  CONTRAST:  100 mL Omnipaque 350  COMPARISON:  08/1913  FINDINGS: Technically adequate study with good opacification of central and segmental pulmonary arteries. No focal filling defects. No evidence of significant pulmonary embolus.  Normal heart size. Calcification in the coronary arteries. Calcification in the aorta without aneurysm. No significant lymphadenopathy in the chest. Visualization of the lungs is limited due to respiratory motion artifact but there appears to be atelectasis in the lung bases, increasing since the previous study. No pneumothorax. No pleural effusions. Airways appear patent. Pulmonary nodules seen previously in the right lower lung and right upper lung are unchanged. Visualized portions of the upper abdominal organs demonstrate apparent fatty infiltration of the liver. Degenerative changes in the spine. No destructive bone lesions  appreciated.  Review of the MIP images confirms the above findings.  IMPRESSION: No evidence of significant pulmonary embolus. Stable appearance of right pulmonary nodules. As previously suggested, followup in 6-12 months is recommended.   Electronically Signed   By: Burman Nieves M.D.   On: 2013-12-02 22:08   Mr Maxine Glenn Head Wo Contrast  11/07/2013   CLINICAL DATA:  Right-sided weakness.  EXAM: MRI HEAD WITHOUT CONTRAST  MRA HEAD WITHOUT CONTRAST  TECHNIQUE: Multiplanar, multiecho pulse sequences of the brain and surrounding structures were obtained without intravenous contrast. Angiographic images of the head were obtained using MRA technique without contrast.  COMPARISON:  12-02-2013 CT. 07/27/2009 MR angiogram circle Willis. No comparison brain MR.  FINDINGS: MRI HEAD FINDINGS  Large left hemispheric infarct involving portions of the left temporal lobe, left parietal lobe (bordering the left occipital lobe), left basal ganglia, left subinsular region, left peri opercular region and portion of the left frontal lobe.  Small infarct posterior right frontal operculum region.  Tiny infarct superior left cerebellum.  No intracranial hemorrhage.  Abnormal appearance left middle cerebral artery. Ectatic basilar artery. Please see below.  Prominent small vessel disease type changes.  Global atrophy. Ventricular prominence probably related to central atrophy rather than hydrocephalus.  No intracranial mass lesion noted on this unenhanced exam.  Cervical spondylotic changes with spinal stenosis and cord flattening C3-4 level.  Cervical medullary junction, pituitary region, pineal region and orbital structures unremarkable.  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  Findings suggestive of embolus with nonvisualized middle cerebral artery branches.  Ectatic internal carotid arteries bilaterally.  Mild to moderate narrowing A1 segment right anterior cerebral artery.   Ectatic vertebral arteries and basilar artery with left vertebral artery dominant.  Nonvisualized left posterior inferior cerebellar artery and both anterior inferior cerebellar arteries.  Narrowing superior cerebral arteries and posterior cerebral arteries bilaterally.  No aneurysm noted.  IMPRESSION: MRI HEAD:  Large left hemispheric infarct the as detailed above.  Small infarct posterior right frontal operculum region.  Tiny infarct superior left cerebellum.  No intracranial hemorrhage.  Findings raise possibility of embolic disease.  Cervical spondylotic changes with spinal stenosis and cord flattening C3-4 level.  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 Findings suggestive of embolus with nonvisualized middle cerebral artery branches.  Please see above for further detail.  These results were called by telephone at the time of interpretation on 11/07/2013 at 4:49 PM to Nicole Cella patients nurse who verbally acknowledged these results.   Electronically Signed   By: Bridgett Larsson M.D.   On: 11/07/2013 16:51   Mr Brain Wo Contrast  11/07/2013   CLINICAL DATA:  Right-sided weakness.  EXAM: MRI HEAD WITHOUT CONTRAST  MRA HEAD WITHOUT CONTRAST  TECHNIQUE: Multiplanar, multiecho pulse sequences of the brain and surrounding structures were obtained without intravenous contrast. Angiographic images of the head were obtained using MRA technique without contrast.  COMPARISON:  11-19-13 CT. 07/27/2009 MR angiogram circle Willis. No comparison brain MR.  FINDINGS: MRI HEAD FINDINGS  Large left hemispheric infarct involving portions of the left temporal lobe, left parietal lobe (bordering the left occipital lobe), left basal ganglia, left subinsular region, left peri opercular region and portion of the left frontal lobe.  Small infarct posterior right frontal operculum region.  Tiny infarct superior left cerebellum.  No intracranial hemorrhage.  Abnormal appearance left  middle cerebral artery. Ectatic basilar artery. Please see below.  Prominent small vessel disease type changes.  Global atrophy. Ventricular prominence probably related to central atrophy rather than hydrocephalus.  No intracranial mass lesion noted on this unenhanced exam.  Cervical spondylotic changes with spinal stenosis and cord flattening C3-4 level.  Cervical medullary junction, pituitary region, pineal region and orbital structures unremarkable.  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 Findings suggestive of embolus with nonvisualized middle cerebral artery branches.  Ectatic internal carotid arteries bilaterally.  Mild to moderate narrowing A1 segment right anterior cerebral artery.  Ectatic vertebral arteries and basilar artery with left vertebral artery dominant.  Nonvisualized left posterior inferior cerebellar artery and both anterior inferior cerebellar arteries.  Narrowing superior cerebral arteries and posterior cerebral arteries bilaterally.  No aneurysm noted.  IMPRESSION: MRI HEAD:  Large left hemispheric infarct the as detailed above.  Small infarct posterior right frontal operculum region.  Tiny infarct superior left cerebellum.  No intracranial hemorrhage.  Findings raise possibility of embolic disease.  Cervical spondylotic changes with spinal stenosis and cord flattening C3-4 level.  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 Findings suggestive of embolus with nonvisualized middle cerebral artery branches.  Please see above for further detail.  These results were called by telephone at the time of interpretation on 11/07/2013 at 4:49 PM to Nicole Cella patients nurse who verbally acknowledged these results.   Electronically Signed   By: Bridgett Larsson M.D.   On:  11/07/2013 16:51   Dg Chest Port 1 View  11/20/2013   CLINICAL DATA:  Code Sepsis  EXAM: PORTABLE CHEST - 1 VIEW  COMPARISON:  None  prior CT from from the same day.  FINDINGS: The cardiac and mediastinal silhouettes are stable in size and contour, and remain within normal limits.  The lungs are mildly hypoinflated. . No airspace consolidation, pleural effusion, or pulmonary edema is identified. There is no pneumothorax. Known subcentimeter pulmonary nodules not well visualized.  No acute osseous abnormality identified. Remotely healed left-sided rib fractures noted.  IMPRESSION: Shallow lung inflation.  No acute cardiopulmonary abnormality.   Electronically Signed   By: Rise Mu M.D.   On: 11/10/2013 22:24    Scheduled Meds: . antiseptic oral rinse  15 mL Mouth Rinse q12n4p  . carvedilol  3.125 mg Oral BID WC  . chlorhexidine  15 mL Mouth Rinse BID  . ipratropium-albuterol  3 mL Nebulization TID  . piperacillin-tazobactam (ZOSYN)  IV  3.375 g Intravenous 3 times per day  . QUEtiapine  25 mg Oral QHS  . rivaroxaban  20 mg Oral Q supper  . sodium chloride  3 mL Intravenous Q12H  . vancomycin  1,000 mg Intravenous Q12H        Time spent: 40 minute   Drema Dallas  [facility] Pager [phone]. If 7PM-7AM, please contact night-coverage at [email or web address], password [facility] 11/08/2013, 4:40 PM  LOS: 2 days

## 2013-11-08 NOTE — Progress Notes (Signed)
Stroke Team Progress Note  HISTORY  Justin Mcdonald is an 79 y.o. male with a past medical history significant for recently diagnosed DVT on xarelto, brought in due to the above stated symptoms.  Patient is not able to provide reliable information and thus all history is obtained form the chart which indicated that he was recently admitted in February at [facility], at which time he was found to have DVT and from there he was sent to rehabilitation facility from where he was discharged 3-4 weeks ago. Patient has been working with home physical therapy.Today after returning from physical therapy the patient was found nonresponsive and confused and therefore they called EMS. Patient's EKG was read by EMS as ST elevation and therefore code STEMI was called.  He was febrile with increase breathing and tachycardia and elevated white count upon arrival and there was also a concern for sepsis.  Of note, patient's wife reported that couple of days ago he pulled muscle on the right side because of which he has been having difficulty moving his right side.  Upon arrival to ED he had a CT brain that did not show acute intracranial abnormality.  Date last known well: 11/07/13  Time last known well: no completely clear, perhaps 1945pm  tPA Given: no, on xarelto  NIHSS: 18  MRS: 3   SUBJECTIVE No family is at bedside. The patient is mute.  OBJECTIVE Most recent Vital Signs: Filed Vitals:   11/08/13 0600 11/08/13 0700 11/08/13 0800 11/08/13 0900  BP: 107/65 106/64 109/66 122/69  Pulse: 83 76 79 80  Temp:   97.4 F (36.3 C)   TempSrc:   Axillary   Resp:   36   Height:      Weight:      SpO2: 97% 96% 97% 98%   CBG (last 3)   Recent Labs  11/19/2013 2127  GLUCAP 115*    IV Fluid Intake:     MEDICATIONS  . antiseptic oral rinse  15 mL Mouth Rinse q12n4p  . aspirin  300 mg Rectal Daily  . carvedilol  3.125 mg Oral BID WC  . chlorhexidine  15 mL Mouth Rinse BID  .  ipratropium-albuterol  3 mL Nebulization TID  . piperacillin-tazobactam (ZOSYN)  IV  3.375 g Intravenous 3 times per day  . QUEtiapine  25 mg Oral QHS  . rivaroxaban  20 mg Oral Q supper  . sodium chloride  3 mL Intravenous Q12H  . vancomycin  1,000 mg Intravenous Q12H   PRN:  ondansetron (ZOFRAN) IV, ondansetron  Diet:  NPO  Activity:  Bedrest DVT Prophylaxis:  SCD  CLINICALLY SIGNIFICANT STUDIES Basic Metabolic Panel:   Recent Labs Lab 11/07/13 0912 11/08/13 0415  NA 136* 139  K 3.7 3.4*  CL 99 103  CO2 22 23  GLUCOSE 110* 104*  BUN 18 17  CREATININE 0.59 0.56  CALCIUM 8.7 8.7  MG  --  1.9   Liver Function Tests:   Recent Labs Lab 11/07/13 0912 11/08/13 0415  AST 24 27  ALT 14 14  ALKPHOS 105 102  BILITOT 0.5 0.5  PROT 6.2 6.2  ALBUMIN 2.6* 2.4*   CBC:  Recent Labs Lab 11/09/2013 2100  11/07/13 0912 11/08/13 0415  WBC 14.4*  --  12.1* 9.8  NEUTROABS 11.7*  --   --  6.7  HGB 12.6*  < > 10.6* 10.4*  HCT 37.5*  < > 31.4* 31.0*  MCV 82.6  --  81.3 82.0  PLT  247  --  227 251  < > = values in this interval not displayed. Coagulation:   Recent Labs Lab 11/21/2013 2100 11/07/13 0912  LABPROT 20.4* 18.7*  INR 1.80* 1.61*   Cardiac Enzymes:   Recent Labs Lab 11/07/13 0912 11/07/13 1715 11/08/13 0415  TROPONINI 0.43* <0.30 <0.30   Urinalysis:   Recent Labs Lab 11/09/2013 2203  COLORURINE AMBER*  LABSPEC 1.037*  PHURINE 6.0  GLUCOSEU NEGATIVE  HGBUR NEGATIVE  BILIRUBINUR SMALL*  KETONESUR 15*  PROTEINUR NEGATIVE  UROBILINOGEN 1.0  NITRITE NEGATIVE  LEUKOCYTESUR NEGATIVE   Lipid Panel    Component Value Date/Time   CHOL 100 11/08/2013 0415   HgbA1C  No results found for this basename: HGBA1C    Urine Drug Screen:     Component Value Date/Time   LABOPIA NONE DETECTED 10/29/2013 2203   COCAINSCRNUR NONE DETECTED 11/13/2013 2203   LABBENZ NONE DETECTED 11/01/2013 2203   AMPHETMU NONE DETECTED 10/29/2013 2203   THCU NONE DETECTED  10/28/2013 2203   LABBARB NONE DETECTED 11/10/2013 2203    Alcohol Level:   Recent Labs Lab 11/25/2013 2100  ETH <11    Ct Head Wo Contrast  11/09/2013   CLINICAL DATA:  Altered mental status.  EXAM: CT HEAD WITHOUT CONTRAST  TECHNIQUE: Contiguous axial images were obtained from the base of the skull through the vertex without intravenous contrast.  COMPARISON:  09/16/2013  FINDINGS: Technically limited study due to motion artifact. Diffuse cerebral atrophy. Ventricular dilatation consistent with central atrophy. Low-attenuation changes throughout the deep white matter consistent with small vessel ischemia. No abnormal extra-axial fluid collections. No mass effect or midline shift. Basal cisterns are not effaced. No evidence of acute intracranial hemorrhage. Prominent dilatation of the basilar artery. Vascular calcifications. Calvarium appears intact. Visualized paranasal sinuses and mastoid air cells are not opacified. No change in appearance of intracranial contents since previous study.  IMPRESSION: No acute intracranial abnormalities. Chronic atrophy and small vessel ischemic changes.   Electronically Signed   By: Burman Nieves M.D.   On: 11/02/2013 21:50   Ct Angio Chest Pe W/cm &/or Wo Cm  11/13/2013   CLINICAL DATA:  Followup varicose veins. Leg swelling. History of previous DVTs. Tachycardia.  EXAM: CT ANGIOGRAPHY CHEST WITH CONTRAST  TECHNIQUE: Multidetector CT imaging of the chest was performed using the standard protocol during bolus administration of intravenous contrast. Multiplanar CT image reconstructions and MIPs were obtained to evaluate the vascular anatomy.  CONTRAST:  100 mL Omnipaque 350  COMPARISON:  08/1913  FINDINGS: Technically adequate study with good opacification of central and segmental pulmonary arteries. No focal filling defects. No evidence of significant pulmonary embolus.  Normal heart size. Calcification in the coronary arteries. Calcification in the aorta without  aneurysm. No significant lymphadenopathy in the chest. Visualization of the lungs is limited due to respiratory motion artifact but there appears to be atelectasis in the lung bases, increasing since the previous study. No pneumothorax. No pleural effusions. Airways appear patent. Pulmonary nodules seen previously in the right lower lung and right upper lung are unchanged. Visualized portions of the upper abdominal organs demonstrate apparent fatty infiltration of the liver. Degenerative changes in the spine. No destructive bone lesions appreciated.  Review of the MIP images confirms the above findings.  IMPRESSION: No evidence of significant pulmonary embolus. Stable appearance of right pulmonary nodules. As previously suggested, followup in 6-12 months is recommended.   Electronically Signed   By: Burman Nieves M.D.   On: 11/10/2013 22:08  Mr Maxine GlennMra Head Wo Contrast  11/07/2013   CLINICAL DATA:  Right-sided weakness.  EXAM: MRI HEAD WITHOUT CONTRAST  MRA HEAD WITHOUT CONTRAST  TECHNIQUE: Multiplanar, multiecho pulse sequences of the brain and surrounding structures were obtained without intravenous contrast. Angiographic images of the head were obtained using MRA technique without contrast.  COMPARISON:  11/01/2013 CT. 07/27/2009 MR angiogram circle Robynne Roat. No comparison brain MR.  FINDINGS: MRI HEAD FINDINGS  Large left hemispheric infarct involving portions of the left temporal lobe, left parietal lobe (bordering the left occipital lobe), left basal ganglia, left subinsular region, left peri opercular region and portion of the left frontal lobe.  Small infarct posterior right frontal operculum region.  Tiny infarct superior left cerebellum.  No intracranial hemorrhage.  Abnormal appearance left middle cerebral artery. Ectatic basilar artery. Please see below.  Prominent small vessel disease type changes.  Global atrophy. Ventricular prominence probably related to central atrophy rather than hydrocephalus.   No intracranial mass lesion noted on this unenhanced exam.  Cervical spondylotic changes with spinal stenosis and cord flattening C3-4 level.  Cervical medullary junction, pituitary region, pineal region and orbital structures unremarkable.  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 Findings suggestive of embolus with nonvisualized middle cerebral artery branches.  Ectatic internal carotid arteries bilaterally.  Mild to moderate narrowing A1 segment right anterior cerebral artery.  Ectatic vertebral arteries and basilar artery with left vertebral artery dominant.  Nonvisualized left posterior inferior cerebellar artery and both anterior inferior cerebellar arteries.  Narrowing superior cerebral arteries and posterior cerebral arteries bilaterally.  No aneurysm noted.  IMPRESSION: MRI HEAD:  Large left hemispheric infarct the as detailed above.  Small infarct posterior right frontal operculum region.  Tiny infarct superior left cerebellum.  No intracranial hemorrhage.  Findings raise possibility of embolic disease.  Cervical spondylotic changes with spinal stenosis and cord flattening C3-4 level.  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 Findings suggestive of embolus with nonvisualized middle cerebral artery branches.  Please see above for further detail.  These results were called by telephone at the time of interpretation on 11/07/2013 at 4:49 PM to Nicole Cellaorothy patients nurse who verbally acknowledged these results.   Electronically Signed   By: Bridgett LarssonSteve  Olson M.D.   On: 11/07/2013 16:51   Mr Brain Wo Contrast  11/07/2013   CLINICAL DATA:  Right-sided weakness.  EXAM: MRI HEAD WITHOUT CONTRAST  MRA HEAD WITHOUT CONTRAST  TECHNIQUE: Multiplanar, multiecho pulse sequences of the brain and surrounding structures were obtained without intravenous contrast. Angiographic images of the head were obtained using MRA  technique without contrast.  COMPARISON:  10/28/2013 CT. 07/27/2009 MR angiogram circle Abubakar Crispo. No comparison brain MR.  FINDINGS: MRI HEAD FINDINGS  Large left hemispheric infarct involving portions of the left temporal lobe, left parietal lobe (bordering the left occipital lobe), left basal ganglia, left subinsular region, left peri opercular region and portion of the left frontal lobe.  Small infarct posterior right frontal operculum region.  Tiny infarct superior left cerebellum.  No intracranial hemorrhage.  Abnormal appearance left middle cerebral artery. Ectatic basilar artery. Please see below.  Prominent small vessel disease type changes.  Global atrophy. Ventricular prominence probably related to central atrophy rather than hydrocephalus.  No intracranial mass lesion noted on this unenhanced exam.  Cervical spondylotic changes with spinal stenosis and cord flattening C3-4 level.  Cervical medullary junction, pituitary region, pineal region  and orbital structures unremarkable.  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 Findings suggestive of embolus with nonvisualized middle cerebral artery branches.  Ectatic internal carotid arteries bilaterally.  Mild to moderate narrowing A1 segment right anterior cerebral artery.  Ectatic vertebral arteries and basilar artery with left vertebral artery dominant.  Nonvisualized left posterior inferior cerebellar artery and both anterior inferior cerebellar arteries.  Narrowing superior cerebral arteries and posterior cerebral arteries bilaterally.  No aneurysm noted.  IMPRESSION: MRI HEAD:  Large left hemispheric infarct the as detailed above.  Small infarct posterior right frontal operculum region.  Tiny infarct superior left cerebellum.  No intracranial hemorrhage.  Findings raise possibility of embolic disease.  Cervical spondylotic changes with spinal stenosis and cord flattening C3-4 level.  MRA HEAD :  Abrupt  occlusion of the proximal M1 segment of the left middle cerebral artery. Findings suggestive of embolus with nonvisualized middle cerebral artery branches.  Please see above for further detail.  These results were called by telephone at the time of interpretation on 11/07/2013 at 4:49 PM to Nicole Cella patients nurse who verbally acknowledged these results.   Electronically Signed   By: Bridgett Larsson M.D.   On: 11/07/2013 16:51   Dg Chest Port 1 View  10/31/2013   CLINICAL DATA:  Code Sepsis  EXAM: PORTABLE CHEST - 1 VIEW  COMPARISON:  None prior CT from from the same day.  FINDINGS: The cardiac and mediastinal silhouettes are stable in size and contour, and remain within normal limits.  The lungs are mildly hypoinflated. . No airspace consolidation, pleural effusion, or pulmonary edema is identified. There is no pneumothorax. Known subcentimeter pulmonary nodules not well visualized.  No acute osseous abnormality identified. Remotely healed left-sided rib fractures noted.  IMPRESSION: Shallow lung inflation.  No acute cardiopulmonary abnormality.   Electronically Signed   By: Rise Mu M.D.   On: 11/03/2013 22:24    CT of the brain   IMPRESSION:  No acute intracranial abnormalities. Chronic atrophy and small  vessel ischemic changes.   MRI of the brain   IMPRESSION:  MRI HEAD:  Large left hemispheric infarct the as detailed above.  Small infarct posterior right frontal operculum region.  Tiny infarct superior left cerebellum.  No intracranial hemorrhage.  Findings raise possibility of embolic disease.  Cervical spondylotic changes with spinal stenosis and cord  flattening C3-4 level.   MRA of the brain   MRA HEAD :  Abrupt occlusion of the proximal M1 segment of the left middle  cerebral artery. Findings suggestive of embolus with nonvisualized  middle cerebral artery branches.  2D Echocardiogram   Study Conclusions  - Left ventricle: The cavity size was normal. Wall  thickness was increased in a pattern of mild LVH. There was mild focal basal hypertrophy of the septum. Systolic function was normal. The estimated ejection fraction was in the range of 60% to 65%. Wall motion was normal; there were no regional wall motion abnormalities. Doppler parameters are consistent with abnormal left ventricular relaxation (grade 1 diastolic dysfunction). - Ascending aorta: The ascending aorta was mildly dilated. - Mitral valve: Mild regurgitation. - Left atrium: The atrium was moderately dilated. - Pericardium, extracardiac: A trivial pericardial effusion was identified. Impressions:  - Technically difficult; normal LV function; proximal septal thickening with turbulence in LVOT; no significant gradient by doppler.   Carotid Doppler    CXR   IMPRESSION:  Shallow lung inflation. No acute cardiopulmonary abnormality.  EKG  Sinus tachycardia, HR of 121  Therapy Recommendations Pending  Physical Exam  General: The patient is alert and cooperative at the time of the examination.  Skin: No significant peripheral edema is noted.   Neurologic Exam  Mental status: The patient is mute, not following pure verbal commands.  Cranial nerves: Facial symmetry is not present. There is a depression of the right NLF. The patient is mute, not following pure verbal commands, eyes are deviated to the left, no blink to threat from the right, will blink from the left.  Motor: The patient has good strength in left extremities.The patient does not move the right arm or leg.  Sensory examination: difficult to assess, seems to respond to pain on the left side, less well on the right side.  Coordination: The patient is unable to follow commands to perform cerebellar testing.  Gait and station: The gait was not tested.  Reflexes: Deep tendon reflexes are symmetric, but are depressed. There is a right babinski.    ASSESSMENT Mr. Justin Mcdonald is a 80 y.o. male  presenting with a right hemiparesis and mutism. The patient likely has a large left MCA distribution CVA. He was on xarelto PTA for DVT.   Large left brain CVA  DVT on [facility] day # 2  TREATMENT/PLAN  The patient is back on Xarelto, discontinue aspirin.  Carotid doppler is pending  ST eval for swallow  PT and OT eval.  Stroke team will follow  Lipid panel is pending  Consider TEE given recent DVT and CVA R/O PFO. The patient is on Xarelto, the patient likely has a PFO given history of DVT and bihemispheric embolic infarct. Would consider transcranial Doppler bubble study at some point. The patient is already being anticoagulated, however.  York Spaniel  11/08/2013 10:06 AM

## 2013-11-08 NOTE — Progress Notes (Signed)
Pt taken off BIPAP previously by RN and placed on 2lpm Powers. Pt's family concerned that when pt was wearing BIPAP mask, mask was sliding up into his mouth, and didn't feel like he had enough room to breathe. Explained to family that with facial hair, it is harder to get mask to seal perfectly. Told pt's family I would try a venti mask to see if that helped with his oxygenation and comfort since pt is a mouth breather. RT will continue to monitor.

## 2013-11-08 NOTE — Progress Notes (Signed)
Patient wife and niece visiting.They are concerned about patient being uncomfortable on BIPAP,the mask being too tight and sliding to his mouth. Patient sat 100% RR 28-32.Family reassuared and Respiratory Therapist paged. Dr Joseph ArtWoods paged and was requested to update the family who had lots of questions.Dr Joseph Artwoods called and spoke with patient wife.Noted DNR order.

## 2013-11-08 NOTE — Consult Note (Signed)
[facility]  Total days of antibiotics 3        Day 3 vanco        Day 3 piptazo               Reason for Consult: enterococcal bacteremia    Referring Physician: woods  Principal Problem:   Sepsis Active Problems:   DVT (deep venous thrombosis)   Altered mental status   Right sided weakness   Acute bronchitis   Diastolic CHF    HPI: Justin Mcdonald is a 80 y.o. male with recent hospitalization for DVT in February who has been in rehab was admitted on 11/06/12 after having acute onset of confusion and unresponsiveness while participating in PT. Initially thought to be STEMI by EKG-> then RBBB. But work up revealed embolic infarct on head imaging. He reported having right sided lower extremity weakness 2 days prior to admit thought to be due to muscle strain. On admit, he was found to be febrile to 101.4, leukocytosis of 14.4. Blood cx are showing 2/2 + enterococcus. MRI of brain, showsLarge left hemispheric infarct involving portions of the left temporal lobe, left parietal lobe (bordering the left occipital  lobe), left basal ganglia, left subinsular region, left peri opercular region and portion of the left frontal lobe.  Small infarct posterior right frontal operculum region. Tiny infarct superior left cerebellum. All concerning for embolic disease. Infectious diseases consulted for concern of endocarditis/bacteremia. This hpi extracted from records since patient is still encephalopathy and wearing bipap   Past Medical History  Diagnosis Date  . Varicose vein of leg     Allergies: No Known Allergies   MEDICATIONS: . antiseptic oral rinse  15 mL Mouth Rinse q12n4p  . carvedilol  3.125 mg Oral BID WC  . chlorhexidine  15 mL Mouth Rinse BID  . ipratropium-albuterol  3 mL Nebulization TID  . piperacillin-tazobactam (ZOSYN)  IV  3.375 g Intravenous 3 times per day  . QUEtiapine  25 mg Oral QHS  . rivaroxaban  20 mg Oral Q supper  . sodium chloride  3 mL  Intravenous Q12H  . vancomycin  1,000 mg Intravenous Q12H    History  Substance Use Topics  . Smoking status: Former Research scientist (life sciences)  . Smokeless tobacco: Not on file  . Alcohol Use: No    History reviewed. No pertinent family history.   Review of Systems  Unable to obtain due to encephalopathy OBJECTIVE: Temp:  [97.4 F (36.3 C)-101.4 F (38.6 C)] 97.4 F (36.3 C) (04/12 0800) Pulse Rate:  [76-102] 80 (04/12 1100) Resp:  [22-36] 28 (04/12 1100) BP: (105-135)/(61-76) 124/66 mmHg (04/12 1100) SpO2:  [90 %-99 %] 98 % (04/12 1100) FiO2 (%):  [40 %] 40 % (04/12 1100) Weight:  [193 lb 5.5 oz (87.7 kg)] 193 lb 5.5 oz (87.7 kg) (04/12 0347)   Constitutional: He appears confused/altered ,reaching into the air with his left arm. He appears chronically ill. Wearing bipap  HENT:  Mouth/Throat: uanble to visualize since wearing bipap Cardiovascular: Normal rate, regular rhythm and normal heart sounds. Exam reveals no gallop and no friction rub.  No murmur heard. Mildly obscured by bipap Pulmonary/Chest: Effort normal and breath sounds normal. No respiratory distress. He has no wheezes.  Abdominal: Soft. Bowel sounds are normal. He exhibits no distension. There is no tenderness.  Lymphadenopathy:  no cervical adenopathy.  Neurological: resting tremor to left hand Skin: Skin is warm and dry. No rash noted. No erythema. No  splinter hemorrhage Psychiatric: uanble to assess  LABS: Results for orders placed during the hospital encounter of 11/22/2013 (from the past 48 hour(s))  ETHANOL     Status: None   Collection Time    11/13/2013  9:00 PM      Result Value Ref Range   Alcohol, Ethyl (B) <11  0 - 11 mg/dL   Comment:            LOWEST DETECTABLE LIMIT FOR     SERUM ALCOHOL IS 11 mg/dL     FOR MEDICAL PURPOSES ONLY  PROTIME-INR     Status: Abnormal   Collection Time    11/18/2013  9:00 PM      Result Value Ref Range   Prothrombin Time 20.4 (*) 11.6 - 15.2 seconds   INR 1.80 (*) 0.00 - 1.49    APTT     Status: None   Collection Time    11/05/2013  9:00 PM      Result Value Ref Range   aPTT 35  24 - 37 seconds  COMPREHENSIVE METABOLIC PANEL     Status: Abnormal   Collection Time    11/22/2013  9:00 PM      Result Value Ref Range   Sodium 134 (*) 137 - 147 mEq/L   Potassium 4.2  3.7 - 5.3 mEq/L   Chloride 94 (*) 96 - 112 mEq/L   CO2 21  19 - 32 mEq/L   Glucose, Bld 115 (*) 70 - 99 mg/dL   BUN 19  6 - 23 mg/dL   Creatinine, Ser 0.68  0.50 - 1.35 mg/dL   Calcium 9.3  8.4 - 10.5 mg/dL   Total Protein 7.4  6.0 - 8.3 g/dL   Albumin 2.9 (*) 3.5 - 5.2 g/dL   AST 29  0 - 37 U/L   ALT 18  0 - 53 U/L   Alkaline Phosphatase 123 (*) 39 - 117 U/L   Total Bilirubin 0.7  0.3 - 1.2 mg/dL   GFR calc non Af Amer 85 (*) >90 mL/min   GFR calc Af Amer >90  >90 mL/min   Comment: (NOTE)     The eGFR has been calculated using the CKD EPI equation.     This calculation has not been validated in all clinical situations.     eGFR's persistently <90 mL/min signify possible Chronic Kidney     Disease.  TROPONIN I     Status: None   Collection Time    11/07/2013  9:00 PM      Result Value Ref Range   Troponin I <0.30  <0.30 ng/mL   Comment:            Due to the release kinetics of cTnI,     a negative result within the first hours     of the onset of symptoms does not rule out     myocardial infarction with certainty.     If myocardial infarction is still suspected,     repeat the test at appropriate intervals.  CBC WITH DIFFERENTIAL     Status: Abnormal   Collection Time    11/05/2013  9:00 PM      Result Value Ref Range   WBC 14.4 (*) 4.0 - 10.5 K/uL   RBC 4.54  4.22 - 5.81 MIL/uL   Hemoglobin 12.6 (*) 13.0 - 17.0 g/dL   HCT 37.5 (*) 39.0 - 52.0 %   MCV 82.6  78.0 - 100.0 fL  MCH 27.8  26.0 - 34.0 pg   MCHC 33.6  30.0 - 36.0 g/dL   RDW 14.5  11.5 - 15.5 %   Platelets 247  150 - 400 K/uL   Neutrophils Relative % 82 (*) 43 - 77 %   Neutro Abs 11.7 (*) 1.7 - 7.7 K/uL   Lymphocytes  Relative 11 (*) 12 - 46 %   Lymphs Abs 1.6  0.7 - 4.0 K/uL   Monocytes Relative 7  3 - 12 %   Monocytes Absolute 1.0  0.1 - 1.0 K/uL   Eosinophils Relative 0  0 - 5 %   Eosinophils Absolute 0.0  0.0 - 0.7 K/uL   Basophils Relative 0  0 - 1 %   Basophils Absolute 0.0  0.0 - 0.1 K/uL  CBG MONITORING, ED     Status: Abnormal   Collection Time    11/15/2013  9:27 PM      Result Value Ref Range   Glucose-Capillary 115 (*) 70 - 99 mg/dL  I-STAT TROPOININ, ED     Status: None   Collection Time    10/29/2013  9:33 PM      Result Value Ref Range   Troponin i, poc 0.01  0.00 - 0.08 ng/mL   Comment 3            Comment: Due to the release kinetics of cTnI,     a negative result within the first hours     of the onset of symptoms does not rule out     myocardial infarction with certainty.     If myocardial infarction is still suspected,     repeat the test at appropriate intervals.  I-STAT CHEM 8, ED     Status: Abnormal   Collection Time    10/28/2013  9:35 PM      Result Value Ref Range   Sodium 132 (*) 137 - 147 mEq/L   Potassium 4.0  3.7 - 5.3 mEq/L   Chloride 99  96 - 112 mEq/L   BUN 18  6 - 23 mg/dL   Creatinine, Ser 0.90  0.50 - 1.35 mg/dL   Glucose, Bld 121 (*) 70 - 99 mg/dL   Calcium, Ion 1.12 (*) 1.13 - 1.30 mmol/L   TCO2 23  0 - 100 mmol/L   Hemoglobin 13.9  13.0 - 17.0 g/dL   HCT 41.0  39.0 - 52.0 %  URINE RAPID DRUG SCREEN (HOSP PERFORMED)     Status: None   Collection Time    11/23/2013 10:03 PM      Result Value Ref Range   Opiates NONE DETECTED  NONE DETECTED   Cocaine NONE DETECTED  NONE DETECTED   Benzodiazepines NONE DETECTED  NONE DETECTED   Amphetamines NONE DETECTED  NONE DETECTED   Tetrahydrocannabinol NONE DETECTED  NONE DETECTED   Barbiturates NONE DETECTED  NONE DETECTED   Comment:            DRUG SCREEN FOR MEDICAL PURPOSES     ONLY.  IF CONFIRMATION IS NEEDED     FOR ANY PURPOSE, NOTIFY LAB     WITHIN 5 DAYS.                LOWEST DETECTABLE LIMITS      FOR URINE DRUG SCREEN     Drug Class       Cutoff (ng/mL)     Amphetamine      1000     Barbiturate  200     Benzodiazepine   700     Tricyclics       174     Opiates          300     Cocaine          300     THC              50  URINALYSIS, ROUTINE W REFLEX MICROSCOPIC     Status: Abnormal   Collection Time    11/03/2013 10:03 PM      Result Value Ref Range   Color, Urine AMBER (*) YELLOW   Comment: BIOCHEMICALS MAY BE AFFECTED BY COLOR   APPearance CLEAR  CLEAR   Specific Gravity, Urine 1.037 (*) 1.005 - 1.030   pH 6.0  5.0 - 8.0   Glucose, UA NEGATIVE  NEGATIVE mg/dL   Hgb urine dipstick NEGATIVE  NEGATIVE   Bilirubin Urine SMALL (*) NEGATIVE   Ketones, ur 15 (*) NEGATIVE mg/dL   Protein, ur NEGATIVE  NEGATIVE mg/dL   Urobilinogen, UA 1.0  0.0 - 1.0 mg/dL   Nitrite NEGATIVE  NEGATIVE   Leukocytes, UA NEGATIVE  NEGATIVE   Comment: MICROSCOPIC NOT DONE ON URINES WITH NEGATIVE PROTEIN, BLOOD, LEUKOCYTES, NITRITE, OR GLUCOSE <1000 mg/dL.  URINE CULTURE     Status: None   Collection Time    11/24/2013 10:03 PM      Result Value Ref Range   Specimen Description URINE, CATHETERIZED     Special Requests NONE     Culture  Setup Time       Value: 11/05/2013 22:47     Performed at [facility] Count       Value: NO GROWTH     Performed at [facility]   Culture       Value: NO GROWTH     Performed at [facility]   Report Status 11/08/2013 FINAL    CULTURE, BLOOD (ROUTINE X 2)     Status: None   Collection Time    11/10/2013 10:13 PM      Result Value Ref Range   Specimen Description BLOOD RIGHT HAND     Special Requests BOTTLES DRAWN AEROBIC AND ANAEROBIC 4 CC     Culture  Setup Time       Value: 11/07/2013 03:16     Performed at [facility]   Culture       Value: ENTEROCOCCUS SPECIES     Note: Gram Stain Report Called to,Read Back By and Verified With: [facility] 11/07/13 @ 4:32PM RUSCA.     Performed at [facility]     Report Status PENDING    CULTURE, BLOOD (ROUTINE X 2)     Status: None   Collection Time    11/16/2013 10:17 PM      Result Value Ref Range   Specimen Description BLOOD LEFT FOREARM     Special Requests BOTTLES DRAWN AEROBIC ONLY 8 CC     Culture  Setup Time       Value: 11/07/2013 03:16     Performed at [facility]   Culture       Value: ENTEROCOCCUS SPECIES     Note: Gram Stain Report Called to,Read Back By and Verified With: DOROTHY Anurag 11/07/13 @ 4:32PM BY RUSCA     Performed at [facility]   Report Status PENDING    I-STAT CG4 LACTIC ACID, ED  Status: Abnormal   Collection Time    11/08/2013 10:26 PM      Result Value Ref Range   Lactic Acid, Venous 2.21 (*) 0.5 - 2.2 mmol/L  I-STAT ARTERIAL BLOOD GAS, ED     Status: Abnormal   Collection Time    11/03/2013 11:38 PM      Result Value Ref Range   pH, Arterial 7.418  7.350 - 7.450   pCO2 arterial 33.5 (*) 35.0 - 45.0 mmHg   pO2, Arterial 116.0 (*) 80.0 - 100.0 mmHg   Bicarbonate 21.6  20.0 - 24.0 mEq/L   TCO2 23  0 - 100 mmol/L   O2 Saturation 99.0     Acid-base deficit 2.0  0.0 - 2.0 mmol/L   Collection site RADIAL, ALLEN'S TEST ACCEPTABLE     Drawn by RT     Sample type ARTERIAL    MRSA PCR SCREENING     Status: None   Collection Time    11/07/13  1:49 AM      Result Value Ref Range   MRSA by PCR NEGATIVE  NEGATIVE   Comment:            The GeneXpert MRSA Assay (FDA     approved for NASAL specimens     only), is one component of a     comprehensive MRSA colonization     surveillance program. It is not     intended to diagnose MRSA     infection nor to guide or     monitor treatment for     MRSA infections.  INFLUENZA PANEL BY PCR (TYPE A & B, H1N1)     Status: None   Collection Time    11/07/13  2:41 AM      Result Value Ref Range   Influenza A By PCR NEGATIVE  NEGATIVE   Influenza B By PCR NEGATIVE  NEGATIVE   H1N1 flu by pcr NOT DETECTED  NOT DETECTED   Comment:            The Xpert Flu  assay (FDA approved for     nasal aspirates or washes and     nasopharyngeal swab specimens), is     intended as an aid in the diagnosis of     influenza and should not be used as     a sole basis for treatment.  AMMONIA     Status: None   Collection Time    11/07/13  6:32 AM      Result Value Ref Range   Ammonia 32  11 - 60 umol/L  PRO B NATRIURETIC PEPTIDE     Status: None   Collection Time    11/07/13  6:37 AM      Result Value Ref Range   Pro B Natriuretic peptide (BNP) 244.9  0 - 450 pg/mL  TSH     Status: None   Collection Time    11/07/13  6:37 AM      Result Value Ref Range   TSH 1.300  0.350 - 4.500 uIU/mL   Comment: Please note change in reference range.  BLOOD GAS, ARTERIAL     Status: Abnormal   Collection Time    11/07/13  6:50 AM      Result Value Ref Range   FIO2 0.40     Delivery systems BILEVEL POSITIVE AIRWAY PRESSURE     Mode BILEVEL POSITIVE AIRWAY PRESSURE     Inspiratory PAP 10     Expiratory  PAP 5     pH, Arterial 7.425  7.350 - 7.450   pCO2 arterial 35.7  35.0 - 45.0 mmHg   pO2, Arterial 131.0 (*) 80.0 - 100.0 mmHg   Bicarbonate 23.0  20.0 - 24.0 mEq/L   TCO2 24.1  0 - 100 mmol/L   Acid-base deficit 0.8  0.0 - 2.0 mmol/L   O2 Saturation 98.5     Patient temperature 98.6     Collection site RIGHT RADIAL     Drawn by [id number]     Sample type ARTERIAL DRAW     Allens test (pass/fail) PASS  PASS  TROPONIN I     Status: Abnormal   Collection Time    11/07/13  9:12 AM      Result Value Ref Range   Troponin I 0.43 (*) <0.30 ng/mL   Comment:            Due to the release kinetics of cTnI,     a negative result within the first hours     of the onset of symptoms does not rule out     myocardial infarction with certainty.     If myocardial infarction is still suspected,     repeat the test at appropriate intervals.     CRITICAL RESULT CALLED TO, READ BACK BY AND VERIFIED WITH:     [email or web address],[id number] 11/07/13 M.CAMPBELL  COMPREHENSIVE METABOLIC PANEL      Status: Abnormal   Collection Time    11/07/13  9:12 AM      Result Value Ref Range   Sodium 136 (*) 137 - 147 mEq/L   Potassium 3.7  3.7 - 5.3 mEq/L   Chloride 99  96 - 112 mEq/L   CO2 22  19 - 32 mEq/L   Glucose, Bld 110 (*) 70 - 99 mg/dL   BUN 18  6 - 23 mg/dL   Creatinine, Ser 0.59  0.50 - 1.35 mg/dL   Comment: DELTA CHECK NOTED   Calcium 8.7  8.4 - 10.5 mg/dL   Total Protein 6.2  6.0 - 8.3 g/dL   Albumin 2.6 (*) 3.5 - 5.2 g/dL   AST 24  0 - 37 U/L   ALT 14  0 - 53 U/L   Alkaline Phosphatase 105  39 - 117 U/L   Total Bilirubin 0.5  0.3 - 1.2 mg/dL   GFR calc non Af Amer 90 (*) >90 mL/min   GFR calc Af Amer >90  >90 mL/min   Comment: (NOTE)     The eGFR has been calculated using the CKD EPI equation.     This calculation has not been validated in all clinical situations.     eGFR's persistently <90 mL/min signify possible Chronic Kidney     Disease.  CBC     Status: Abnormal   Collection Time    11/07/13  9:12 AM      Result Value Ref Range   WBC 12.1 (*) 4.0 - 10.5 K/uL   RBC 3.86 (*) 4.22 - 5.81 MIL/uL   Hemoglobin 10.6 (*) 13.0 - 17.0 g/dL   Comment: DELTA CHECK NOTED     REPEATED TO VERIFY   HCT 31.4 (*) 39.0 - 52.0 %   MCV 81.3  78.0 - 100.0 fL   MCH 27.5  26.0 - 34.0 pg   MCHC 33.8  30.0 - 36.0 g/dL   RDW 14.5  11.5 - 15.5 %   Platelets 227  150 - 400 K/uL  PROTIME-INR  Status: Abnormal   Collection Time    11/07/13  9:12 AM      Result Value Ref Range   Prothrombin Time 18.7 (*) 11.6 - 15.2 seconds   INR 1.61 (*) 0.00 - 1.49  APTT     Status: Abnormal   Collection Time    11/07/13  9:12 AM      Result Value Ref Range   aPTT 40 (*) 24 - 37 seconds   Comment:            IF BASELINE aPTT IS ELEVATED,     SUGGEST PATIENT RISK ASSESSMENT     BE USED TO DETERMINE APPROPRIATE     ANTICOAGULANT THERAPY.  TROPONIN I     Status: None   Collection Time    11/07/13  5:15 PM      Result Value Ref Range   Troponin I <0.30  <0.30 ng/mL   Comment:              Due to the release kinetics of cTnI,     a negative result within the first hours     of the onset of symptoms does not rule out     myocardial infarction with certainty.     If myocardial infarction is still suspected,     repeat the test at appropriate intervals.  CBC WITH DIFFERENTIAL     Status: Abnormal   Collection Time    11/08/13  4:15 AM      Result Value Ref Range   WBC 9.8  4.0 - 10.5 K/uL   RBC 3.78 (*) 4.22 - 5.81 MIL/uL   Hemoglobin 10.4 (*) 13.0 - 17.0 g/dL   HCT 31.0 (*) 39.0 - 52.0 %   MCV 82.0  78.0 - 100.0 fL   MCH 27.5  26.0 - 34.0 pg   MCHC 33.5  30.0 - 36.0 g/dL   RDW 14.7  11.5 - 15.5 %   Platelets 251  150 - 400 K/uL   Neutrophils Relative % 68  43 - 77 %   Neutro Abs 6.7  1.7 - 7.7 K/uL   Lymphocytes Relative 10 (*) 12 - 46 %   Lymphs Abs 1.0  0.7 - 4.0 K/uL   Monocytes Relative 22 (*) 3 - 12 %   Monocytes Absolute 2.2 (*) 0.1 - 1.0 K/uL   Eosinophils Relative 0  0 - 5 %   Eosinophils Absolute 0.0  0.0 - 0.7 K/uL   Basophils Relative 0  0 - 1 %   Basophils Absolute 0.0  0.0 - 0.1 K/uL  COMPREHENSIVE METABOLIC PANEL     Status: Abnormal   Collection Time    11/08/13  4:15 AM      Result Value Ref Range   Sodium 139  137 - 147 mEq/L   Potassium 3.4 (*) 3.7 - 5.3 mEq/L   Chloride 103  96 - 112 mEq/L   CO2 23  19 - 32 mEq/L   Glucose, Bld 104 (*) 70 - 99 mg/dL   BUN 17  6 - 23 mg/dL   Creatinine, Ser 0.56  0.50 - 1.35 mg/dL   Calcium 8.7  8.4 - 10.5 mg/dL   Total Protein 6.2  6.0 - 8.3 g/dL   Albumin 2.4 (*) 3.5 - 5.2 g/dL   AST 27  0 - 37 U/L   ALT 14  0 - 53 U/L   Alkaline Phosphatase 102  39 - 117 U/L   Total Bilirubin 0.5  0.3 - 1.2 mg/dL   GFR calc non Af Amer >90  >90 mL/min   GFR calc Af Amer >90  >90 mL/min   Comment: (NOTE)     The eGFR has been calculated using the CKD EPI equation.     This calculation has not been validated in all clinical situations.     eGFR's persistently <90 mL/min signify possible Chronic Kidney      Disease.  MAGNESIUM     Status: None   Collection Time    11/08/13  4:15 AM      Result Value Ref Range   Magnesium 1.9  1.5 - 2.5 mg/dL  LACTIC ACID, PLASMA     Status: None   Collection Time    11/08/13  4:15 AM      Result Value Ref Range   Lactic Acid, Venous 1.0  0.5 - 2.2 mmol/L  TROPONIN I     Status: None   Collection Time    11/08/13  4:15 AM      Result Value Ref Range   Troponin I <0.30  <0.30 ng/mL   Comment:            Due to the release kinetics of cTnI,     a negative result within the first hours     of the onset of symptoms does not rule out     myocardial infarction with certainty.     If myocardial infarction is still suspected,     repeat the test at appropriate intervals.  LIPID PANEL     Status: Abnormal   Collection Time    11/08/13  4:15 AM      Result Value Ref Range   Cholesterol 100  0 - 200 mg/dL   Triglycerides 88  <150 mg/dL   HDL 14 (*) >39 mg/dL   Total CHOL/HDL Ratio 7.1     VLDL 18  0 - 40 mg/dL   LDL Cholesterol 68  0 - 99 mg/dL   Comment:            Total Cholesterol/HDL:CHD Risk     Coronary Heart Disease Risk Table                         Men   Women      1/2 Average Risk   3.4   3.3      Average Risk       5.0   4.4      2 X Average Risk   9.6   7.1      3 X Average Risk  23.4   11.0                Use the calculated Patient Ratio     above and the CHD Risk Table     to determine the patient's CHD Risk.                ATP III CLASSIFICATION (LDL):      <100     mg/dL   Optimal      100-129  mg/dL   Near or Above                        Optimal      130-159  mg/dL   Borderline      160-189  mg/dL   High      >190     mg/dL  Very High    MICRO:  IMAGING: Ct Head Wo Contrast  11/20/2013   CLINICAL DATA:  Altered mental status.  EXAM: CT HEAD WITHOUT CONTRAST  TECHNIQUE: Contiguous axial images were obtained from the base of the skull through the vertex without intravenous contrast.  COMPARISON:  09/16/2013  FINDINGS:  Technically limited study due to motion artifact. Diffuse cerebral atrophy. Ventricular dilatation consistent with central atrophy. Low-attenuation changes throughout the deep white matter consistent with small vessel ischemia. No abnormal extra-axial fluid collections. No mass effect or midline shift. Basal cisterns are not effaced. No evidence of acute intracranial hemorrhage. Prominent dilatation of the basilar artery. Vascular calcifications. Calvarium appears intact. Visualized paranasal sinuses and mastoid air cells are not opacified. No change in appearance of intracranial contents since previous study.  IMPRESSION: No acute intracranial abnormalities. Chronic atrophy and small vessel ischemic changes.   Electronically Signed   By: Burman Nieves M.D.   On: 11/13/2013 21:50   Ct Angio Chest Pe W/cm &/or Wo Cm  10/31/2013   CLINICAL DATA:  Followup varicose veins. Leg swelling. History of previous DVTs. Tachycardia.  EXAM: CT ANGIOGRAPHY CHEST WITH CONTRAST  TECHNIQUE: Multidetector CT imaging of the chest was performed using the standard protocol during bolus administration of intravenous contrast. Multiplanar CT image reconstructions and MIPs were obtained to evaluate the vascular anatomy.  CONTRAST:  100 mL Omnipaque 350  COMPARISON:  [id number]/1913  FINDINGS: Technically adequate study with good opacification of central and segmental pulmonary arteries. No focal filling defects. No evidence of significant pulmonary embolus.  Normal heart size. Calcification in the coronary arteries. Calcification in the aorta without aneurysm. No significant lymphadenopathy in the chest. Visualization of the lungs is limited due to respiratory motion artifact but there appears to be atelectasis in the lung bases, increasing since the previous study. No pneumothorax. No pleural effusions. Airways appear patent. Pulmonary nodules seen previously in the right lower lung and right upper lung are unchanged. Visualized portions of  the upper abdominal organs demonstrate apparent fatty infiltration of the liver. Degenerative changes in the spine. No destructive bone lesions appreciated.  Review of the MIP images confirms the above findings.  IMPRESSION: No evidence of significant pulmonary embolus. Stable appearance of right pulmonary nodules. As previously suggested, followup in 6-12 months is recommended.   Electronically Signed   By: Burman Nieves M.D.   On: 10/29/2013 22:08   Mr Maxine Glenn Head Wo Contrast  11/07/2013   CLINICAL DATA:  Right-sided weakness.  EXAM: MRI HEAD WITHOUT CONTRAST  MRA HEAD WITHOUT CONTRAST  TECHNIQUE: Multiplanar, multiecho pulse sequences of the brain and surrounding structures were obtained without intravenous contrast. Angiographic images of the head were obtained using MRA technique without contrast.  COMPARISON:  11/22/2013 CT. 07/27/2009 MR angiogram circle Willis. No comparison brain MR.  FINDINGS: MRI HEAD FINDINGS  Large left hemispheric infarct involving portions of the left temporal lobe, left parietal lobe (bordering the left occipital lobe), left basal ganglia, left subinsular region, left peri opercular region and portion of the left frontal lobe.  Small infarct posterior right frontal operculum region.  Tiny infarct superior left cerebellum.  No intracranial hemorrhage.  Abnormal appearance left middle cerebral artery. Ectatic basilar artery. Please see below.  Prominent small vessel disease type changes.  Global atrophy. Ventricular prominence probably related to central atrophy rather than hydrocephalus.  No intracranial mass lesion noted on this unenhanced exam.  Cervical spondylotic changes with spinal stenosis and cord flattening C3-4 level.  Cervical medullary junction, pituitary  region, pineal region and orbital structures unremarkable.  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 Findings suggestive of embolus with nonvisualized middle  cerebral artery branches.  Ectatic internal carotid arteries bilaterally.  Mild to moderate narrowing A1 segment right anterior cerebral artery.  Ectatic vertebral arteries and basilar artery with left vertebral artery dominant.  Nonvisualized left posterior inferior cerebellar artery and both anterior inferior cerebellar arteries.  Narrowing superior cerebral arteries and posterior cerebral arteries bilaterally.  No aneurysm noted.  IMPRESSION: MRI HEAD:  Large left hemispheric infarct the as detailed above.  Small infarct posterior right frontal operculum region.  Tiny infarct superior left cerebellum.  No intracranial hemorrhage.  Findings raise possibility of embolic disease.  Cervical spondylotic changes with spinal stenosis and cord flattening C3-4 level.  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 Findings suggestive of embolus with nonvisualized middle cerebral artery branches.  Please see above for further detail.  These results were called by telephone at the time of interpretation on 11/07/2013 at 4:49 PM to Nicole Cella patients nurse who verbally acknowledged these results.   Electronically Signed   By: Bridgett Larsson M.D.   On: 11/07/2013 16:51   Mr Brain Wo Contrast  11/07/2013   CLINICAL DATA:  Right-sided weakness.  EXAM: MRI HEAD WITHOUT CONTRAST  MRA HEAD WITHOUT CONTRAST  TECHNIQUE: Multiplanar, multiecho pulse sequences of the brain and surrounding structures were obtained without intravenous contrast. Angiographic images of the head were obtained using MRA technique without contrast.  COMPARISON:  11/21/2013 CT. 07/27/2009 MR angiogram circle Willis. No comparison brain MR.  FINDINGS: MRI HEAD FINDINGS  Large left hemispheric infarct involving portions of the left temporal lobe, left parietal lobe (bordering the left occipital lobe), left basal ganglia, left subinsular region, left peri opercular region and portion of the left frontal lobe.  Small  infarct posterior right frontal operculum region.  Tiny infarct superior left cerebellum.  No intracranial hemorrhage.  Abnormal appearance left middle cerebral artery. Ectatic basilar artery. Please see below.  Prominent small vessel disease type changes.  Global atrophy. Ventricular prominence probably related to central atrophy rather than hydrocephalus.  No intracranial mass lesion noted on this unenhanced exam.  Cervical spondylotic changes with spinal stenosis and cord flattening C3-4 level.  Cervical medullary junction, pituitary region, pineal region and orbital structures unremarkable.  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 Findings suggestive of embolus with nonvisualized middle cerebral artery branches.  Ectatic internal carotid arteries bilaterally.  Mild to moderate narrowing A1 segment right anterior cerebral artery.  Ectatic vertebral arteries and basilar artery with left vertebral artery dominant.  Nonvisualized left posterior inferior cerebellar artery and both anterior inferior cerebellar arteries.  Narrowing superior cerebral arteries and posterior cerebral arteries bilaterally.  No aneurysm noted.  IMPRESSION: MRI HEAD:  Large left hemispheric infarct the as detailed above.  Small infarct posterior right frontal operculum region.  Tiny infarct superior left cerebellum.  No intracranial hemorrhage.  Findings raise possibility of embolic disease.  Cervical spondylotic changes with spinal stenosis and cord flattening C3-4 level.  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 Findings suggestive of embolus with nonvisualized middle cerebral artery branches.  Please see above for further detail.  These results were called by telephone at the time of interpretation on 11/07/2013 at  4:49 PM to Earlie Server patients nurse who verbally acknowledged these results.   Electronically Signed   By: Chauncey Cruel  M.D.   On: 11/07/2013 16:51   Dg Chest Port 1 View  11/16/2013   CLINICAL DATA:  Code Sepsis  EXAM: PORTABLE CHEST - 1 VIEW  COMPARISON:  None prior CT from from the same day.  FINDINGS: The cardiac and mediastinal silhouettes are stable in size and contour, and remain within normal limits.  The lungs are mildly hypoinflated. . No airspace consolidation, pleural effusion, or pulmonary edema is identified. There is no pneumothorax. Known subcentimeter pulmonary nodules not well visualized.  No acute osseous abnormality identified. Remotely healed left-sided rib fractures noted.  IMPRESSION: Shallow lung inflation.  No acute cardiopulmonary abnormality.   Electronically Signed   By: Jeannine Boga M.D.   On: 11/13/2013 22:24    HISTORICAL MICRO/IMAGING  Assessment/Plan:  80yo M with fever, leukocytosis, AMS and weakness, found to have enterococcal bacteremia and MRI brain suggestive of embolic infarct concerning for endocarditis  - will repeat blood cx today - please get TTE to evaluate if evidence of further vegetations as a starting point. Given his current status unclear if he would be able to tolerate TEE - would continue on vancomycin and piptazo for now, awaiting speciation. If amp S, can narrow to ampicillin   Caren Griffins B. [facility] [phone]

## 2013-11-09 ENCOUNTER — Inpatient Hospital Stay (HOSPITAL_COMMUNITY): Payer: Medicare Other

## 2013-11-09 DIAGNOSIS — R7881 Bacteremia: Secondary | ICD-10-CM | POA: Diagnosis present

## 2013-11-09 DIAGNOSIS — R319 Hematuria, unspecified: Secondary | ICD-10-CM | POA: Diagnosis present

## 2013-11-09 DIAGNOSIS — J9601 Acute respiratory failure with hypoxia: Secondary | ICD-10-CM | POA: Diagnosis present

## 2013-11-09 DIAGNOSIS — E876 Hypokalemia: Secondary | ICD-10-CM | POA: Diagnosis present

## 2013-11-09 LAB — CULTURE, BLOOD (ROUTINE X 2)

## 2013-11-09 LAB — COMPREHENSIVE METABOLIC PANEL
ALBUMIN: 2.4 g/dL — AB (ref 3.5–5.2)
ALK PHOS: 110 U/L (ref 39–117)
ALT: 16 U/L (ref 0–53)
AST: 34 U/L (ref 0–37)
BUN: 15 mg/dL (ref 6–23)
CO2: 23 mEq/L (ref 19–32)
CREATININE: 0.52 mg/dL (ref 0.50–1.35)
Calcium: 8.6 mg/dL (ref 8.4–10.5)
Chloride: 98 mEq/L (ref 96–112)
GFR calc Af Amer: >90 mL/min (ref >90)
GFR calc non Af Amer: >90 mL/min (ref >90)
Glucose, Bld: 109 mg/dL — ABNORMAL HIGH (ref 70–99)
Potassium: 3.4 mEq/L — ABNORMAL LOW (ref 3.7–5.3)
SODIUM: 136 meq/L — AB (ref 137–147)
Total Bilirubin: 0.6 mg/dL (ref 0.3–1.2)
Total Protein: 6.5 g/dL (ref 6.0–8.3)

## 2013-11-09 LAB — CBC WITH DIFFERENTIAL/PLATELET
BASOS ABS: 0 10*3/uL (ref 0.0–0.1)
BASOS PCT: 0 % (ref 0–1)
Eosinophils Absolute: 0 10*3/uL (ref 0.0–0.7)
Eosinophils Relative: 0 % (ref 0–5)
HCT: 32.4 % — ABNORMAL LOW (ref 39.0–52.0)
Hemoglobin: 10.7 g/dL — ABNORMAL LOW (ref 13.0–17.0)
Lymphocytes Relative: 14 % (ref 12–46)
Lymphs Abs: 1.5 10*3/uL (ref 0.7–4.0)
MCH: 27.2 pg (ref 26.0–34.0)
MCHC: 33 g/dL (ref 30.0–36.0)
MCV: 82.2 fL (ref 78.0–100.0)
Monocytes Absolute: 1.5 10*3/uL — ABNORMAL HIGH (ref 0.1–1.0)
Monocytes Relative: 14 % — ABNORMAL HIGH (ref 3–12)
NEUTROS ABS: 7.9 10*3/uL — AB (ref 1.7–7.7)
Neutrophils Relative %: 72 % (ref 43–77)
PLATELETS: 315 10*3/uL (ref 150–400)
RBC: 3.94 MIL/uL — ABNORMAL LOW (ref 4.22–5.81)
RDW: 14.6 % (ref 11.5–15.5)
WBC: 11 10*3/uL — ABNORMAL HIGH (ref 4.0–10.5)

## 2013-11-09 LAB — HIV ANTIBODY (ROUTINE TESTING W REFLEX): HIV 1&2 Ab, 4th Generation: NONREACTIVE

## 2013-11-09 LAB — MAGNESIUM: Magnesium: 2 mg/dL (ref 1.5–2.5)

## 2013-11-09 LAB — LACTIC ACID, PLASMA: LACTIC ACID, VENOUS: 1 mmol/L (ref 0.5–2.2)

## 2013-11-09 MED ORDER — LORAZEPAM 2 MG/ML IJ SOLN
1.0000 mg | INTRAMUSCULAR | Status: DC
  Administered 2013-11-09 – 2013-11-10 (×3): 1 mg via INTRAVENOUS
  Filled 2013-11-09 (×3): qty 1

## 2013-11-09 MED ORDER — ACETAMINOPHEN 650 MG RE SUPP: 650.0000 mg | RECTAL | Status: DC | PRN

## 2013-11-09 MED ORDER — MORPHINE SULFATE 2 MG/ML IJ SOLN
INTRAMUSCULAR | Status: AC
  Filled 2013-11-09: qty 1

## 2013-11-09 MED ORDER — SODIUM CHLORIDE 0.9 % IV SOLN
INTRAVENOUS | Status: DC
Start: 2013-11-09 — End: 2013-11-09
  Administered 2013-11-09: 100 mL/h via INTRAVENOUS
  Administered 2013-11-09: 22:00:00 via INTRAVENOUS

## 2013-11-09 MED ORDER — MORPHINE SULFATE 2 MG/ML IJ SOLN
2.0000 mg | INTRAMUSCULAR | Status: DC | PRN
  Administered 2013-11-09: 2 mg via INTRAVENOUS

## 2013-11-09 MED ORDER — DIGOXIN 0.25 MG/ML IJ SOLN
0.1250 mg | INTRAMUSCULAR | Status: AC
  Administered 2013-11-09: 0.125 mg via INTRAVENOUS
  Filled 2013-11-09: qty 0.5

## 2013-11-09 MED ORDER — MORPHINE SULFATE 10 MG/ML IJ SOLN
1.0000 mg/h | INTRAMUSCULAR | Status: DC
  Administered 2013-11-09: 4 mg/h via INTRAVENOUS
  Administered 2013-11-10 – 2013-11-11 (×3): 10 mg/h via INTRAVENOUS
  Filled 2013-11-09 (×4): qty 10

## 2013-11-09 MED ORDER — GENTAMICIN SULFATE 40 MG/ML IJ SOLN
90.0000 mg | Freq: Three times a day (TID) | INTRAVENOUS | Status: DC
  Administered 2013-11-09 (×2): 90 mg via INTRAVENOUS
  Filled 2013-11-09 (×4): qty 2.25

## 2013-11-09 MED ORDER — MORPHINE SULFATE 2 MG/ML IJ SOLN
2.0000 mg | INTRAMUSCULAR | Status: DC | PRN
  Administered 2013-11-09: 2 mg via INTRAVENOUS
  Filled 2013-11-09: qty 1

## 2013-11-09 MED ORDER — SCOPOLAMINE 1 MG/3DAYS TD PT72
1.0000 | MEDICATED_PATCH | TRANSDERMAL | Status: DC
  Administered 2013-11-10: 1.5 mg via TRANSDERMAL
  Filled 2013-11-09: qty 1

## 2013-11-09 MED ORDER — SODIUM CHLORIDE 0.9 % IV SOLN
2.0000 g | INTRAVENOUS | Status: DC
  Administered 2013-11-09 (×3): 2 g via INTRAVENOUS
  Filled 2013-11-09 (×8): qty 2000

## 2013-11-09 NOTE — Progress Notes (Addendum)
HPI:  RN called this NP because pt had gone into AFib with RVR rate in the 140s. 12 lead EKG ordered and NP to bedside. Mr. 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 Also, sepsis, and acute respiratory failure. His PMHx includes CHF, and DVT on Xeralto. This NP does not find a hx of AFib on the chart.  S: Pt is completely unresponsive and unable to participate in ROS. Per RN, the AFib started about 10 minutes ago.  RN says pt has been on bipap this hospitalization.  O: VS reviewed. This is an acutely ill 80 yo male in acute respiratory distress. He is unresponsive to verbal and tactile stimulation. HR 140s, AFib with RVR. Tachypnea with use of intercostal and abdominal muscles with a RR of 35.  O2 sat is normal on ventimask. No MOE, no following of commands.  A/P: 1. Acute respiratory distress, worsening. I spoke with the wife and discussed situation. She agrees to comfort care at this point. Explained that Bipap is certainly not a permanent solution to his problem. He still remains critically ill with several other issues. MSO4 ordered. She is coming to the hospital.  2. New onset AFib with RVR-his BP is too soft at this time to give Metoprolol or Cardizem. Digoxin 0.125 mg given. Explained this to wife as well.  3. Sepsis-another severe acute issues. On abx and followed by ID.  4. Large left MCA stoke-non responsive.  Pt is a DNR. [name], NP [facility] Addendum: Wife arrived to hospital. Again, this NP went over everything with her and she agrees to complete comfort care. Will d/c all meds except what is needed for comfort. Added Scopolamine and Ativan. Multiple family members in room who all agree with plan. Chaplain called for family. Family thanked care givers for excellent care.  [name], NP [facility] Addendum: Micah FlesherWent to check on pt and family again. Family is coping well stating "we just don't want him to suffer". Pt is gasping  some and still using abd muscles to breathe. Will go up to 10mg /hr on MSO4 drip. Family thankful for care. Chaplain visited and prayed with family.  [name], NP [facility]

## 2013-11-09 NOTE — Progress Notes (Signed)
[facility]    Subjective: Patient getting carotid Dopplers   Antibiotics:  Anti-infectives   Start     Dose/Rate Route Frequency Ordered Stop   11/07/13 1000  vancomycin (VANCOCIN) IVPB 1000 mg/200 mL premix     1,000 mg 200 mL/hr over 60 Minutes Intravenous Every 12 hours 11/07/13 0259     11/07/13 0600  piperacillin-tazobactam (ZOSYN) IVPB 3.375 g     3.375 g 12.5 mL/hr over 240 Minutes Intravenous 3 times per day 11/07/13 0259     Sep 13, 2013 2230  ceFEPIme (MAXIPIME) 1 g in dextrose 5 % 50 mL IVPB  Status:  Discontinued     1 g 100 mL/hr over 30 Minutes Intravenous 3 times per day Sep 13, 2013 2218 11/07/13 0253   Sep 13, 2013 2215  vancomycin (VANCOCIN) 1,500 mg in sodium chloride 0.9 % 500 mL IVPB     1,500 mg 250 mL/hr over 120 Minutes Intravenous  Once Sep 13, 2013 2208 11/07/13 0045      Medications: Scheduled Meds: . antiseptic oral rinse  15 mL Mouth Rinse q12n4p  . carvedilol  3.125 mg Oral BID WC  . chlorhexidine  15 mL Mouth Rinse BID  . ipratropium-albuterol  3 mL Nebulization TID  . piperacillin-tazobactam (ZOSYN)  IV  3.375 g Intravenous 3 times per day  . QUEtiapine  25 mg Oral QHS  . rivaroxaban  20 mg Oral Q supper  . sodium chloride  3 mL Intravenous Q12H  . vancomycin  1,000 mg Intravenous Q12H   Continuous Infusions:  PRN Meds:.ondansetron (ZOFRAN) IV, ondansetron    Objective: Weight change: -1 lb 1.6 oz (-0.5 kg)  Intake/Output Summary (Last 24 hours) at 11/09/13 1214 Last data filed at 11/09/13 0900  Gross per 24 hour  Intake    553 ml  Output    825 ml  Net   -272 ml   Blood pressure 126/80, pulse 105, temperature 99.2 F (37.3 C), temperature source Axillary, resp. rate 35, height 5\' 8"  (1.727 m), weight 192 lb 3.9 oz (87.2 kg), SpO2 95.00%. Temp:  [97.3 F (36.3 C)-101.1 F (38.4 C)] 99.2 F (37.3 C) (04/13 0845) Pulse Rate:  [82-108] 105 (04/13 1013) Resp:  [28-56] 35 (04/13 0845) BP: (111-139)/(61-80) 126/80  mmHg (04/13 0845) SpO2:  [91 %-100 %] 95 % (04/13 1013) FiO2 (%):  [28 %-40 %] 28 % (04/13 1013) Weight:  [192 lb 3.9 oz (87.2 kg)] 192 lb 3.9 oz (87.2 kg) (04/13 0355)  Physical Exam: General: Alert and awake, oriented x3, not in any acute distress. Patient getting carotid Doppler so exam was deferred Neuro: nonfocal  CBC:  Recent Labs Lab Sep 13, 2013 2100 Sep 13, 2013 2135 11/07/13 0912 11/08/13 0415 11/09/13 0259  HGB 12.6* 13.9 10.6* 10.4* 10.7*  HCT 37.5* 41.0 31.4* 31.0* 32.4*  PLT 247  --  227 251 315  INR 1.80*  --  1.61*  --   --   APTT 35  --  40*  --   --      BMET  Recent Labs  11/08/13 0415 11/09/13 0259  NA 139 136*  K 3.4* 3.4*  CL 103 98  CO2 23 23  GLUCOSE 104* 109*  BUN 17 15  CREATININE 0.56 0.52  CALCIUM 8.7 8.6     Liver Panel   Recent Labs  11/08/13 0415 11/09/13 0259  PROT 6.2 6.5  ALBUMIN 2.4* 2.4*  AST 27 34  ALT 14 16  ALKPHOS 102 110  BILITOT 0.5 0.6  Sedimentation Rate No results found for this basename: ESRSEDRATE,  in the last 72 hours C-Reactive Protein No results found for this basename: CRP,  in the last 72 hours  Micro Results: Recent Results (from the past 240 hour(s))  URINE CULTURE     Status: None   Collection Time    12/05/2013 10:03 PM      Result Value Ref Range Status   Specimen Description URINE, CATHETERIZED   Final   Special Requests NONE   Final   Culture  Setup Time     Final   Value: 12-05-2013 22:47     Performed at [facility] Count     Final   Value: NO GROWTH     Performed at [facility]   Culture     Final   Value: NO GROWTH     Performed at [facility]   Report Status 11/08/2013 FINAL   Final  CULTURE, BLOOD (ROUTINE X 2)     Status: None   Collection Time    Dec 05, 2013 10:13 PM      Result Value Ref Range Status   Specimen Description BLOOD RIGHT HAND   Final   Special Requests BOTTLES DRAWN AEROBIC AND ANAEROBIC 4 CC   Final   Culture  Setup  Time     Final   Value: 11/07/2013 03:16     Performed at [facility]   Culture     Final   Value: ENTEROCOCCUS SPECIES     Note: COMBINATION THERAPY OF HIGH DOSE AMPICILLIN OR VANCOMYCIN, PLUS AN AMINOGLYCOSIDE, IS USUALLY INDICATED FOR SERIOUS ENTEROCOCCAL INFECTIONS.     Note: Gram Stain Report Called to,Read Back By and Verified With: [facility] 11/07/13 @ 4:32PM RUSCA.     Performed at [facility]   Report Status 11/09/2013 FINAL   Final   Organism ID, Bacteria ENTEROCOCCUS SPECIES   Final  CULTURE, BLOOD (ROUTINE X 2)     Status: None   Collection Time    12/05/2013 10:17 PM      Result Value Ref Range Status   Specimen Description BLOOD LEFT FOREARM   Final   Special Requests BOTTLES DRAWN AEROBIC ONLY 8 CC   Final   Culture  Setup Time     Final   Value: 11/07/2013 03:16     Performed at [facility]   Culture     Final   Value: ENTEROCOCCUS SPECIES     Note: SUSCEPTIBILITIES PERFORMED ON PREVIOUS CULTURE WITHIN THE LAST 5 DAYS.     Note: Gram Stain Report Called to,Read Back By and Verified With: Justin Mcdonald 11/07/13 @ 4:32PM BY RUSCA     Performed at [facility]   Report Status 11/09/2013 FINAL   Final  MRSA PCR SCREENING     Status: None   Collection Time    11/07/13  1:49 AM      Result Value Ref Range Status   MRSA by PCR NEGATIVE  NEGATIVE Final   Comment:            The GeneXpert MRSA Assay (FDA     approved for NASAL specimens     only), is one component of a     comprehensive MRSA colonization     surveillance program. It is not     intended to diagnose MRSA     infection nor to guide or     monitor treatment for  MRSA infections.  CULTURE, BLOOD (ROUTINE X 2)     Status: None   Collection Time    11/08/13  2:47 PM      Result Value Ref Range Status   Specimen Description BLOOD RIGHT FOREARM   Final   Special Requests BOTTLES DRAWN AEROBIC ONLY 10CC EACH   Final   Culture  Setup Time     Final   Value: 11/08/2013  20:56     Performed at [facility]   Culture     Final   Value:        BLOOD CULTURE RECEIVED NO GROWTH TO DATE CULTURE WILL BE HELD FOR 5 DAYS BEFORE ISSUING A FINAL NEGATIVE REPORT     Performed at [facility]   Report Status PENDING   Incomplete  CULTURE, BLOOD (ROUTINE X 2)     Status: None   Collection Time    11/08/13  2:47 PM      Result Value Ref Range Status   Specimen Description BLOOD RIGHT HAND   Final   Special Requests BOTTLES DRAWN AEROBIC ONLY 10CC   Final   Culture  Setup Time     Final   Value: 11/08/2013 20:56     Performed at [facility]   Culture     Final   Value:        BLOOD CULTURE RECEIVED NO GROWTH TO DATE CULTURE WILL BE HELD FOR 5 DAYS BEFORE ISSUING A FINAL NEGATIVE REPORT     Performed at [facility]   Report Status PENDING   Incomplete    Studies/Results: Mr Maxine Glenn Head Wo Contrast  11/07/2013   CLINICAL DATA:  Right-sided weakness.  EXAM: MRI HEAD WITHOUT CONTRAST  MRA HEAD WITHOUT CONTRAST  TECHNIQUE: Multiplanar, multiecho pulse sequences of the brain and surrounding structures were obtained without intravenous contrast. Angiographic images of the head were obtained using MRA technique without contrast.  COMPARISON:  November 13, 2013 CT. 07/27/2009 MR angiogram circle Willis. No comparison brain MR.  FINDINGS: MRI HEAD FINDINGS  Large left hemispheric infarct involving portions of the left temporal lobe, left parietal lobe (bordering the left occipital lobe), left basal ganglia, left subinsular region, left peri opercular region and portion of the left frontal lobe.  Small infarct posterior right frontal operculum region.  Tiny infarct superior left cerebellum.  No intracranial hemorrhage.  Abnormal appearance left middle cerebral artery. Ectatic basilar artery. Please see below.  Prominent small vessel disease type changes.  Global atrophy. Ventricular prominence probably related to central atrophy rather than hydrocephalus.  No  intracranial mass lesion noted on this unenhanced exam.  Cervical spondylotic changes with spinal stenosis and cord flattening C3-4 level.  Cervical medullary junction, pituitary region, pineal region and orbital structures unremarkable.  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 Findings suggestive of embolus with nonvisualized middle cerebral artery branches.  Ectatic internal carotid arteries bilaterally.  Mild to moderate narrowing A1 segment right anterior cerebral artery.  Ectatic vertebral arteries and basilar artery with left vertebral artery dominant.  Nonvisualized left posterior inferior cerebellar artery and both anterior inferior cerebellar arteries.  Narrowing superior cerebral arteries and posterior cerebral arteries bilaterally.  No aneurysm noted.  IMPRESSION: MRI HEAD:  Large left hemispheric infarct the as detailed above.  Small infarct posterior right frontal operculum region.  Tiny infarct superior left cerebellum.  No intracranial hemorrhage.  Findings raise possibility of embolic disease.  Cervical spondylotic changes  with spinal stenosis and cord flattening C3-4 level.  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 Findings suggestive of embolus with nonvisualized middle cerebral artery branches.  Please see above for further detail.  These results were called by telephone at the time of interpretation on 11/07/2013 at 4:49 PM to Justin Mcdonald patients nurse who verbally acknowledged these results.   Electronically Signed   By: Bridgett Larsson M.D.   On: 11/07/2013 16:51   Mr Brain Wo Contrast  11/07/2013   CLINICAL DATA:  Right-sided weakness.  EXAM: MRI HEAD WITHOUT CONTRAST  MRA HEAD WITHOUT CONTRAST  TECHNIQUE: Multiplanar, multiecho pulse sequences of the brain and surrounding structures were obtained without intravenous contrast. Angiographic images of the head were obtained using MRA  technique without contrast.  COMPARISON:  11/10/2013 CT. 07/27/2009 MR angiogram circle Willis. No comparison brain MR.  FINDINGS: MRI HEAD FINDINGS  Large left hemispheric infarct involving portions of the left temporal lobe, left parietal lobe (bordering the left occipital lobe), left basal ganglia, left subinsular region, left peri opercular region and portion of the left frontal lobe.  Small infarct posterior right frontal operculum region.  Tiny infarct superior left cerebellum.  No intracranial hemorrhage.  Abnormal appearance left middle cerebral artery. Ectatic basilar artery. Please see below.  Prominent small vessel disease type changes.  Global atrophy. Ventricular prominence probably related to central atrophy rather than hydrocephalus.  No intracranial mass lesion noted on this unenhanced exam.  Cervical spondylotic changes with spinal stenosis and cord flattening C3-4 level.  Cervical medullary junction, pituitary region, pineal region and orbital structures unremarkable.  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 Findings suggestive of embolus with nonvisualized middle cerebral artery branches.  Ectatic internal carotid arteries bilaterally.  Mild to moderate narrowing A1 segment right anterior cerebral artery.  Ectatic vertebral arteries and basilar artery with left vertebral artery dominant.  Nonvisualized left posterior inferior cerebellar artery and both anterior inferior cerebellar arteries.  Narrowing superior cerebral arteries and posterior cerebral arteries bilaterally.  No aneurysm noted.  IMPRESSION: MRI HEAD:  Large left hemispheric infarct the as detailed above.  Small infarct posterior right frontal operculum region.  Tiny infarct superior left cerebellum.  No intracranial hemorrhage.  Findings raise possibility of embolic disease.  Cervical spondylotic changes with spinal stenosis and cord flattening C3-4 level.  MRA HEAD :  Abrupt  occlusion of the proximal M1 segment of the left middle cerebral artery. Findings suggestive of embolus with nonvisualized middle cerebral artery branches.  Please see above for further detail.  These results were called by telephone at the time of interpretation on 11/07/2013 at 4:49 PM to Justin Mcdonald patients nurse who verbally acknowledged these results.   Electronically Signed   By: Bridgett Larsson M.D.   On: 11/07/2013 16:51      Assessment/Plan:  Principal Problem:   Sepsis Active Problems:   DVT (deep venous thrombosis)   Altered mental status   Right sided weakness   Acute bronchitis   Diastolic CHF    Justin Mcdonald is a 80 y.o. male with fever, leukocytosis, AMS and weakness, found to have enterococcal bacteremia and MRI brain suggestive of embolic infarct concerning for endocarditis  #1 Amp sensitive Enterococcal bacteremia with likely endocarditis based on evidence of septic emboli to brain.   In isolation of an organism known to cause endocarditis and findings on imaging suspicious for septic emboli I  feel we'll need to proceed presumptively to treat this patient for infectious endocarditis.  If he did tolerate a transesophageal echocardiogram this might give us further data but it would not exclude the possibility that prior vegetations and embolized to the brain.  Given the organism enterococcus he is going to need a second drug in the form of aminoglycoside. I certainly worry about the risk of nephrotoxicity with this drug but we need to start with that and see how well he tolerates it. If need be we can try a more suboptimal strategy of giving him high-dose ampicillin along with high-dose ceftriaxone. For now I think the most trident trusted way to go is with ampicillin plus gentamicin  #2 Screening: not high risk demographic but will get HIV, hep panel as I do on [facility] patient I am consulted on    LOS: 3 days   Justin Mcdonald 11/09/2013, 12:14 PM

## 2013-11-09 NOTE — Evaluation (Signed)
Occupational Therapy Evaluation Patient Details Name: Justin Mcdonald MRN: [id number] DOB: 07-20-1927 Today's Date: 11/09/2013    History of Present Illness The patient is a 80 year old male who presented with complaints of altered mental status. Patient was recently admitted in February at [facility], at which time he was found to have DVT and from there he was sent to [facility] where he was discharged 3-4 weeks ago. MRI showed L CVA   Clinical Impression   This 80 yo male needing some A pta and was receiving physical therapy at home pta admitted with above presents to acute OT with decreased arousal, decreased eye opening, decreased movement of right side, decreased bed mobility/balance (per PT note earlier) thus affecting his ability to help care for himself. He will benefit from acute OT with follow up on CIR.    Follow Up Recommendations  CIR    Equipment Recommendations   (TBD at next venue)       Precautions / Restrictions Precautions Precautions: Fall Precaution Comments: venturi Restrictions Weight Bearing Restrictions: No              ADL Overall ADL's : Needs assistance/impaired                                       General ADL Comments: Pt currently needs total A due to lethargy     Vision                 Additional Comments: Pt with eyes closed entire session. When I open both eyelids he does move eyes together left and right (not to command)          Pertinent Vitals/Pain withdrawls to nail bed pressure on the right hand     Hand Dominance Right   Extremity/Trunk Assessment Upper Extremity Assessment Upper Extremity Assessment: RUE deficits/detail;LUE deficits/detail RUE Deficits / Details: pt with localized withdrawl to pain on right hand for nail bed pressure x 2; PROM WNL, no AROM noted RUE Coordination: decreased fine motor;decreased gross motor LUE Deficits / Details: moving LUE spontaneously after mitt  removed, holding his wife's hand intermittently. Asked if he missed his dog Scientific laboratory technician(Justin Mcdonald) and he gave a thumbs up            Communication Communication Communication:  (Pt on venturi and not attempting communication)   Cognition Arousal/Alertness: Lethargic Behavior During Therapy: Flat affect Overall Cognitive Status: Impaired/Different from baseline Area of Impairment: Following commands       Following Commands: Follows one step commands inconsistently;Follows one step commands with increased time (squeeze and let go of my hand)       General Comments: expressive difficulties              Home Living Family/patient expects to be discharged to:: Private residence Living Arrangements: Spouse/significant other Available Help at Discharge: Family;Available 24 hours/day Type of Home: House Home Access: Level entry     Home Layout: One level               Home Equipment: Walker - 2 wheels;Shower seat;Wheelchair - manual;Grab bars - tub/shower;Cane - single point          Prior Functioning/Environment Level of Independence: Needs assistance  Gait / Transfers Assistance Needed: pt was walking with RW in home limited distance with wife supervision with WC for transport outside of the home ADL's / Homemaking Assistance Needed:  wife was assisting at times with dressing and supervising bathing but reports pt was doing the bath on his own Communication / Swallowing Assistance Needed: wife states he didn't eat for the last 4 days Comments: PLOF per wife    OT Diagnosis: Generalized weakness;Cognitive deficits;Disturbance of vision;Hemiplegia dominant side   OT Problem List: Decreased strength;Decreased range of motion;Impaired balance (sitting and/or standing);Decreased activity tolerance;Impaired UE functional use;Obesity;Impaired vision/perception;Decreased knowledge of use of DME or AE;Decreased cognition;Decreased coordination   OT Treatment/Interventions: Self-care/ADL  training;Visual/perceptual remediation/compensation;Patient/family education;Balance training;Neuromuscular education;Therapeutic activities;Therapeutic exercise;DME and/or AE instruction;Cognitive remediation/compensation    OT Goals(Current goals can be found in the care plan section) Acute Rehab OT Goals Patient Stated Goal: wife would like pt to be able to function to return home OT Goal Formulation: With patient/family Time For Goal Achievement: 11/23/13 Potential to Achieve Goals: Fair  OT Frequency: Min 2X/week              End of Session Nurse Communication:  (Mitt is off of left hand)  Activity Tolerance: Patient limited by lethargy Patient left: in bed;with family/visitor present (wife to let RN know when she is ready to have his RUE mitt put back on)   Time: 1536-1550 OT Time Calculation (min): 14 min Charges:  OT General Charges $OT Visit: 1 Procedure OT Evaluation $Initial OT Evaluation Tier I: 1 Procedure OT Treatments $Cognitive Skills Development: 8-22 mins  Evette GeorgesCatherine Eva Fotios Amos [phone] 11/09/2013, 4:11 PM

## 2013-11-09 NOTE — Progress Notes (Signed)
Pt lethargic throughout shift. No increase in alertness when wife arrived. Pt OOB in chair for 4 hours. Pt unable to swallow at this time, therefor meds held.

## 2013-11-09 NOTE — Progress Notes (Signed)
Rehab Admissions Coordinator Note:  Patient was screened by Trish MageEugenia M Josephyne Tarter for appropriateness for an Inpatient Acute Rehab Consult.  At this time, we are recommending that we see how patient does with next couple of therapy sessions and then consider inpatient rehab consult if it seems appropriate.    Ellouise Newerugenia M Eriyanna Kofoed 11/09/2013, 9:02 AM  I can be reached at [phone].

## 2013-11-09 NOTE — Progress Notes (Signed)
Bilateral carotid artery duplex:  1-39% ICA stenosis.  Vertebral artery flow is antegrade.     

## 2013-11-09 NOTE — Progress Notes (Signed)
INITIAL NUTRITION ASSESSMENT  DOCUMENTATION CODES Per approved criteria  -Not Applicable   INTERVENTION: 1.  Modify diet; resume PO diet once medically appropriate per MD discretion.  NUTRITION DIAGNOSIS: Inadequate oral intake related to inability to eat as evidenced by NPO, increased work of breathing.  Monitor:  1.  Food/Beverage; pt meeting >/=90% estimated needs with tolerance. 2.  Wt/wt change; monitor trends  Reason for Assessment: MST  80 y.o. male  Admitting Dx: stroke  ASSESSMENT: Pt admitted due to AMS.  Pt with h/o DVT; found to have new stroke.  RD met with pt who is non-verbal and on venturi mask at time of visit.  No evidence of muscle or subcutaneous fat wasting on exam.  Per chart review, pt with recent weight of 205 lbs in December of 2014, may have experienced a 6% wt change in the past 4 months.  No family at bedside for additional nutrition hx.  RD to follow for ongoing assessment and interventions.   Height: Ht Readings from Last 1 Encounters:  11/07/13 _0  (1.727 m)    Weight: Wt Readings from Last 1 Encounters:  11/09/13 192 lb 3.9 oz (87.2 kg)    Ideal Body Weight: 70kg  % Ideal Body Weight: 124%  Wt Readings from Last 10 Encounters:  11/09/13 192 lb 3.9 oz (87.2 kg)  07/02/13 206 lb (93.441 kg)  11/06/12 204 lb (92.534 kg)    Usual Body Weight: 205 lbs  % Usual Body Weight: 93%  BMI:  Body mass index is 29.24 kg/(m^2).  Estimated Nutritional Needs: Kcal: 2000-2180 Protein: 88-105g Fluid: ~2.0 L/day  Skin: WNL  Diet Order: NPO  EDUCATION NEEDS: -No education needs identified at this time   Intake/Output Summary (Last 24 hours) at 11/09/13 1409 Last data filed at 11/09/13 0900  Gross per 24 hour  Intake    503 ml  Output    825 ml  Net   -322 ml    Last BM: 4/12  Labs:   Recent Labs Lab 11/07/13 0912 11/08/13 0415 11/09/13 0259  NA 136* 139 136*  K 3.7 3.4* 3.4*  CL 99 103 98  CO2 _1 BUN _2 CREATININE 0.59 0.56 0.52  CALCIUM 8.7 8.7 8.6  MG  --  1.9 2.0  GLUCOSE 110* 104* 109*    CBG (last 3)   Recent Labs  11/08/2013 2127  GLUCAP 115*    Scheduled Meds: . ampicillin (OMNIPEN) IV  2 g Intravenous 6 times per day  . antiseptic oral rinse  15 mL Mouth Rinse q12n4p  . carvedilol  3.125 mg Oral BID WC  . chlorhexidine  15 mL Mouth Rinse BID  . gentamicin  90 mg Intravenous Q8H  . ipratropium-albuterol  3 mL Nebulization TID  . QUEtiapine  25 mg Oral QHS  . rivaroxaban  20 mg Oral Q supper  . sodium chloride  3 mL Intravenous Q12H    Continuous Infusions: . sodium chloride      Past Medical History  Diagnosis Date  . Varicose vein of leg     Past Surgical History  Procedure Laterality Date  . Fracture surgery  294    leg  . Hernia repair  1996    Brynda Greathouse, MS RD LDN Clinical Inpatient Dietitian Pager: [phone] Weekend/After hours pager: [phone]

## 2013-11-09 NOTE — Progress Notes (Signed)
SLP Cancellation Note  Patient Details Name: Comer Locketvo Mulvaney MRN: [id number] DOB: [birth year]   Cancelled treatment:       Reason Eval/Treat Not Completed: Medical issues which prohibited therapy. Per RN pt is struggling with respiration, has called RT. SLP will defer eval today and return tomorrow if pt able to participate.   Harlon DittyBonnie Nikaya Nasby, [place] CCC-SLP [phone]  Riley NearingBonnie Caroline Yaseen Gilberg 11/09/2013, 9:17 AM

## 2013-11-09 NOTE — Progress Notes (Signed)
Pt's HR suddenly went into afib 120-150s, K.Kirby NP notified and EKG performed. Pt also exhibits increased WOB yet remains unresponsive which has been his baseline. K.Kirby NP to bedside and new orders carried out.

## 2013-11-09 NOTE — Progress Notes (Signed)
ANTIBIOTIC CONSULT NOTE - INITIAL  Pharmacy Consult for Gentamicin Indication: Synergy w/ Ampicillin for Enterococcal Endocarditis  No Known Allergies Patient Measurements: Height: 5\' 8"  (172.7 cm) Weight: 192 lb 3.9 oz (87.2 kg) IBW/kg (Calculated) : 68.4 Vital Signs: Temp: 98.9 F (37.2 C) (04/13 1233) Temp src: Axillary (04/13 1233) BP: 126/80 mmHg (04/13 1233) Pulse Rate: 97 (04/13 1233) Intake/Output from previous day: 04/12 0701 - 04/13 0700 In: 553 [I.V.:3; IV Piggyback:550] Out: 825 [Urine:825] Intake/Output from this shift: Total I/O In: 200 [IV Piggyback:200] Out: 200 [Urine:200] Labs:  Recent Labs  11/07/13 0912 11/08/13 0415 11/09/13 0259  WBC 12.1* 9.8 11.0*  HGB 10.6* 10.4* 10.7*  PLT 227 251 315  CREATININE 0.59 0.56 0.52   Estimated Creatinine Clearance: 72.5 ml/min (by C-G formula based on Cr of 0.52). Using SCr closer to 1 for age 97>80yo- estimated CrCl 65-70 ml/min  Microbiology: 4/12 Bcx2 >> ngtd 4/10 Bcx2>> Enterococcus (S- Amp, Gent, Vanc) 4/10 sputum >> no result 4/10 Ucx>>neg   Medical History: Past Medical History  Diagnosis Date  . 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 Tmax 101.1, WBC with slight rise at 11. SCr stable but estimate CrCl 65-10970ml/min using SCr ~1 in age 78>80yo.   Goal of Therapy:  Gent Peak 3-4 Gent Trough <1  Plan:  Add gentamicin 90mg  (1mg /kg) IV q8h.  Continue Ampicillin 2g IV q4h.  Monitor renal function and adjust dosing as needed. Monitor gentamicin level at steady state -will check trough tomorrow.   Link SnufferJessica Jeriko Kowalke, PharmD, BCPS Clinical Pharmacist [phone] 11/09/2013,1:06 PM

## 2013-11-09 NOTE — Progress Notes (Signed)
Physical Therapy Treatment Patient Details Name: Comer Locketvo Arenas MRN: [id number] DOB: 1927-07-18 Today's Date: 11/09/2013    History of Present Illness The patient is a 80 year old male who presented with complaints of altered mental status. Patient was recently admitted in February at [facility], at which time he was found to have DVT and from there he was sent to [facility] where he was discharged 3-4 weeks ago. MRI showed L CVA    PT Comments    Pt with improved balance and decreased pushing EOB today but decreased interraction and alertness today. Even with lethargy pt able to assist with reaching for rail and will grasp rails when hand placed to them and assist with moving LLE with max tactile cueing. RLE continues to demonstrate minimal dorsiflexion and toe wiggling with tickling foot. Will continue to follow and RN present for lift bed to chair and aware of function.   Follow Up Recommendations  CIR;Supervision/Assistance - 24 hour     Equipment Recommendations       Recommendations for Other Services       Precautions / Restrictions Precautions Precautions: Fall Precaution Comments: venturi, aphasia    Mobility  Bed Mobility Overal bed mobility: Needs Assistance;+2 for physical assistance Bed Mobility: Rolling;Sidelying to Sit;Sit to Supine Rolling: Max assist;+2 for physical assistance Sidelying to sit: Total assist;+2 for physical assistance   Sit to supine: +2 for physical assistance;Max assist   General bed mobility comments: pt assisting with rolling with hand over hand assist to reach LUE to rail and max assist initially to bend left knee with facilitation at pelvis and trunk to roll. With side to sit total assist to elevate trunk and transfer and with return to trunk in semisidely for prep for lift mod assist to lower trunk only to surface  Transfers                 General transfer comment: Pt lifted from EOB to chair via  maximove  Ambulation/Gait                 Stairs            Wheelchair Mobility    Modified Rankin (Stroke Patients Only)       Balance Overall balance assessment: Needs assistance Sitting-balance support: Single extremity supported;No upper extremity supported;Feet supported Sitting balance-Leahy Scale: Zero Sitting balance - Comments: Pt EOB 18 min with feet supported and with LUE on foot rail for balance min assist for anterior translation and balance and with no UE support pt with right posterior lean but not pushing other than minimally at times today. Max multimodal cues and assist for midline and pt not opening eyes or attempting any communication today. Postural control: Right lateral lean;Posterior lean                          Cognition Arousal/Alertness: Lethargic Behavior During Therapy: Flat affect Overall Cognitive Status: Impaired/Different from baseline                      Exercises      General Comments        Pertinent Vitals/Pain HR 105 sats 90-95%on  28% venturi throughout CPOT = 0    Home Living                      Prior Function  PT Goals (current goals can now be found in the care plan section) Progress towards PT goals: Progressing toward goals (very slowly)    Frequency  Min 3X/week    PT Plan Frequency needs to be updated;Current plan remains appropriate    Co-evaluation             End of Session   Activity Tolerance: Patient limited by fatigue Patient left: in chair;with call bell/phone within reach;with nursing/sitter in room     Time: 0931-1004 PT Time Calculation (min): 33 min  Charges:  $Therapeutic Activity: 23-37 mins                    G Codes:      Makayli Bracken B Maxene Byington 11/09/2013, 10:18 AM Delaney MeigsMaija Tabor Anjanette Gilkey, PT [phone]

## 2013-11-09 NOTE — Progress Notes (Signed)
Stroke Team Progress Note  HISTORY  Suhaan Perleberg is an 80 y.o. male with a past medical history significant for recently diagnosed DVT on xarelto, brought in due to the above stated symptoms.  Patient is not able to provide reliable information and thus all history is obtained form the chart which indicated that he was recently admitted in February at [facility], at which time he was found to have DVT and from there he was sent to rehabilitation facility from where he was discharged 3-4 weeks ago. Patient has been working with home physical therapy.Today after returning from physical therapy the patient was found nonresponsive and confused and therefore they called EMS. Patient's EKG was read by EMS as ST elevation and therefore code STEMI was called.  He was febrile with increase breathing and tachycardia and elevated white count upon arrival and there was also a concern for sepsis.  Of note, patient's wife reported that couple of days ago he pulled muscle on the right side because of which he has been having difficulty moving his right side.  Upon arrival to ED he had a CT brain that did not show acute intracranial abnormality.  Date last known well: 11/07/13  Time last known well: no completely clear, perhaps 1945pm  tPA Given: no, on xarelto  NIHSS: 18  MRS: 3   SUBJECTIVE No family is at bedside. The patient is mute.  OBJECTIVE Most recent Vital Signs: Filed Vitals:   11/09/13 0200 11/09/13 0355 11/09/13 0818 11/09/13 0845  BP: 135/76 131/80  126/80  Pulse: 103 105  100  Temp:  101.1 F (38.4 C)  99.2 F (37.3 C)  TempSrc:  Axillary  Axillary  Resp: 56 55  35  Height:      Weight:  192 lb 3.9 oz (87.2 kg)    SpO2: 92% 94% 96% 95%   CBG (last 3)   Recent Labs  11/24/2013 2127  GLUCAP 115*    IV Fluid Intake:     MEDICATIONS  . antiseptic oral rinse  15 mL Mouth Rinse q12n4p  . carvedilol  3.125 mg Oral BID WC  . chlorhexidine  15 mL Mouth Rinse BID  .  ipratropium-albuterol  3 mL Nebulization TID  . piperacillin-tazobactam (ZOSYN)  IV  3.375 g Intravenous 3 times per day  . QUEtiapine  25 mg Oral QHS  . rivaroxaban  20 mg Oral Q supper  . sodium chloride  3 mL Intravenous Q12H  . vancomycin  1,000 mg Intravenous Q12H   PRN:  ondansetron (ZOFRAN) IV, ondansetron  Diet:  NPO  Activity:  Bedrest DVT Prophylaxis:  SCD  CLINICALLY SIGNIFICANT STUDIES Basic Metabolic Panel:   Recent Labs Lab 11/08/13 0415 11/09/13 0259  NA 139 136*  K 3.4* 3.4*  CL 103 98  CO2 23 23  GLUCOSE 104* 109*  BUN 17 15  CREATININE 0.56 0.52  CALCIUM 8.7 8.6  MG 1.9 2.0   Liver Function Tests:   Recent Labs Lab 11/08/13 0415 11/09/13 0259  AST 27 34  ALT 14 16  ALKPHOS 102 110  BILITOT 0.5 0.6  PROT 6.2 6.5  ALBUMIN 2.4* 2.4*   CBC:   Recent Labs Lab 11/08/13 0415 11/09/13 0259  WBC 9.8 11.0*  NEUTROABS 6.7 7.9*  HGB 10.4* 10.7*  HCT 31.0* 32.4*  MCV 82.0 82.2  PLT 251 315   Coagulation:   Recent Labs Lab 11/19/2013 2100 11/07/13 0912  LABPROT 20.4* 18.7*  INR 1.80* 1.61*   Cardiac Enzymes:  Recent Labs Lab 11/07/13 0912 11/07/13 1715 11/08/13 0415  TROPONINI 0.43* <0.30 <0.30   Urinalysis:   Recent Labs Lab 11/18/2013 2203  COLORURINE AMBER*  LABSPEC 1.037*  PHURINE 6.0  GLUCOSEU NEGATIVE  HGBUR NEGATIVE  BILIRUBINUR SMALL*  KETONESUR 15*  PROTEINUR NEGATIVE  UROBILINOGEN 1.0  NITRITE NEGATIVE  LEUKOCYTESUR NEGATIVE   Lipid Panel    Component Value Date/Time   CHOL 100 11/08/2013 0415   HgbA1C  No results found for this basename: HGBA1C    Urine Drug Screen:     Component Value Date/Time   LABOPIA NONE DETECTED 10/29/2013 2203   COCAINSCRNUR NONE DETECTED 11/03/2013 2203   LABBENZ NONE DETECTED 11/16/2013 2203   AMPHETMU NONE DETECTED 11/01/2013 2203   THCU NONE DETECTED 11/23/2013 2203   LABBARB NONE DETECTED 11/04/2013 2203    Alcohol Level:   Recent Labs Lab 11/15/2013 2100  ETH <11    Mr  Maxine GlennMra Head Wo Contrast  11/07/2013   CLINICAL DATA:  Right-sided weakness.  EXAM: MRI HEAD WITHOUT CONTRAST  MRA HEAD WITHOUT CONTRAST  TECHNIQUE: Multiplanar, multiecho pulse sequences of the brain and surrounding structures were obtained without intravenous contrast. Angiographic images of the head were obtained using MRA technique without contrast.  COMPARISON:  11/08/2013 CT. 07/27/2009 MR angiogram circle Shevaun Lovan. No comparison brain MR.  FINDINGS: MRI HEAD FINDINGS  Large left hemispheric infarct involving portions of the left temporal lobe, left parietal lobe (bordering the left occipital lobe), left basal ganglia, left subinsular region, left peri opercular region and portion of the left frontal lobe.  Small infarct posterior right frontal operculum region.  Tiny infarct superior left cerebellum.  No intracranial hemorrhage.  Abnormal appearance left middle cerebral artery. Ectatic basilar artery. Please see below.  Prominent small vessel disease type changes.  Global atrophy. Ventricular prominence probably related to central atrophy rather than hydrocephalus.  No intracranial mass lesion noted on this unenhanced exam.  Cervical spondylotic changes with spinal stenosis and cord flattening C3-4 level.  Cervical medullary junction, pituitary region, pineal region and orbital structures unremarkable.  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 Findings suggestive of embolus with nonvisualized middle cerebral artery branches.  Ectatic internal carotid arteries bilaterally.  Mild to moderate narrowing A1 segment right anterior cerebral artery.  Ectatic vertebral arteries and basilar artery with left vertebral artery dominant.  Nonvisualized left posterior inferior cerebellar artery and both anterior inferior cerebellar arteries.  Narrowing superior cerebral arteries and posterior cerebral arteries bilaterally.  No aneurysm noted.  IMPRESSION: MRI HEAD:  Large  left hemispheric infarct the as detailed above.  Small infarct posterior right frontal operculum region.  Tiny infarct superior left cerebellum.  No intracranial hemorrhage.  Findings raise possibility of embolic disease.  Cervical spondylotic changes with spinal stenosis and cord flattening C3-4 level.  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 Findings suggestive of embolus with nonvisualized middle cerebral artery branches.  Please see above for further detail.  These results were called by telephone at the time of interpretation on 11/07/2013 at 4:49 PM to Nicole Cellaorothy patients nurse who verbally acknowledged these results.   Electronically Signed   By: Bridgett LarssonSteve  Olson M.D.   On: 11/07/2013 16:51   Mr Brain Wo Contrast  11/07/2013   CLINICAL DATA:  Right-sided weakness.  EXAM: MRI HEAD WITHOUT CONTRAST  MRA HEAD WITHOUT CONTRAST  TECHNIQUE: Multiplanar, multiecho pulse sequences of the brain  and surrounding structures were obtained without intravenous contrast. Angiographic images of the head were obtained using MRA technique without contrast.  COMPARISON:  11/18/2013 CT. 07/27/2009 MR angiogram circle Eleonora Peeler. No comparison brain MR.  FINDINGS: MRI HEAD FINDINGS  Large left hemispheric infarct involving portions of the left temporal lobe, left parietal lobe (bordering the left occipital lobe), left basal ganglia, left subinsular region, left peri opercular region and portion of the left frontal lobe.  Small infarct posterior right frontal operculum region.  Tiny infarct superior left cerebellum.  No intracranial hemorrhage.  Abnormal appearance left middle cerebral artery. Ectatic basilar artery. Please see below.  Prominent small vessel disease type changes.  Global atrophy. Ventricular prominence probably related to central atrophy rather than hydrocephalus.  No intracranial mass lesion noted on this unenhanced exam.  Cervical spondylotic changes with spinal stenosis  and cord flattening C3-4 level.  Cervical medullary junction, pituitary region, pineal region and orbital structures unremarkable.  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 Findings suggestive of embolus with nonvisualized middle cerebral artery branches.  Ectatic internal carotid arteries bilaterally.  Mild to moderate narrowing A1 segment right anterior cerebral artery.  Ectatic vertebral arteries and basilar artery with left vertebral artery dominant.  Nonvisualized left posterior inferior cerebellar artery and both anterior inferior cerebellar arteries.  Narrowing superior cerebral arteries and posterior cerebral arteries bilaterally.  No aneurysm noted.  IMPRESSION: MRI HEAD:  Large left hemispheric infarct the as detailed above.  Small infarct posterior right frontal operculum region.  Tiny infarct superior left cerebellum.  No intracranial hemorrhage.  Findings raise possibility of embolic disease.  Cervical spondylotic changes with spinal stenosis and cord flattening C3-4 level.  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 Findings suggestive of embolus with nonvisualized middle cerebral artery branches.  Please see above for further detail.  These results were called by telephone at the time of interpretation on 11/07/2013 at 4:49 PM to Nicole Cella patients nurse who verbally acknowledged these results.   Electronically Signed   By: Bridgett Larsson M.D.   On: 11/07/2013 16:51    CT of the brain   IMPRESSION:  No acute intracranial abnormalities. Chronic atrophy and small  vessel ischemic changes.   MRI of the brain   IMPRESSION:  MRI HEAD:  Large left hemispheric infarct the as detailed above.  Small infarct posterior right frontal operculum region.  Tiny infarct superior left cerebellum.  No intracranial hemorrhage.  Findings raise possibility of embolic disease.  Cervical spondylotic changes  with spinal stenosis and cord  flattening C3-4 level.   MRA of the brain   MRA HEAD :  Abrupt occlusion of the proximal M1 segment of the left middle  cerebral artery. Findings suggestive of embolus with nonvisualized  middle cerebral artery branches.  2D Echocardiogram   Study Conclusions  - Left ventricle: The cavity size was normal. Wall thickness was increased in a pattern of mild LVH. There was mild focal basal hypertrophy of the septum. Systolic function was normal. The estimated ejection fraction was in the range of 60% to 65%. Wall motion was normal; there were no regional wall motion abnormalities. Doppler parameters are consistent with abnormal left ventricular relaxation (grade 1 diastolic dysfunction). - Ascending aorta: The ascending aorta was mildly dilated. - Mitral valve: Mild regurgitation. - Left atrium: The atrium was moderately dilated. - Pericardium, extracardiac: A trivial pericardial effusion was identified. Impressions:  -  Technically difficult; normal LV function; proximal septal thickening with turbulence in LVOT; no significant gradient by doppler.   Carotid Doppler    CXR   IMPRESSION:  Shallow lung inflation. No acute cardiopulmonary abnormality.  EKG  Sinus tachycardia, HR of 121  Therapy Recommendations Pending  Physical Exam  General: The patient is sleepy, can be aroused.  Skin: No significant peripheral edema is noted.   Neurologic Exam  Mental status: The patient is mute, not following pure verbal commands.  Cranial nerves: Facial symmetry is not present. There is a depression of the right NLF. The patient is mute, not following pure verbal commands, eyes are deviated to the left, no blink to threat from the right, will blink from the left.  Motor: The patient has good strength in left extremities, will hold the left arm up.The patient does not move the right arm or leg.  Sensory examination: difficult to assess, seems to  respond to pain on the left side, less well on the right side.  Coordination: The patient is unable to follow commands to perform cerebellar testing.  Gait and station: The gait was not tested.  Reflexes: Deep tendon reflexes are symmetric, but are depressed. There is a right babinski.    ASSESSMENT Mr. Comer Locketvo Godlewski is a [age] y.o. male presenting with a right hemiparesis and mutism. The patient likely has a large left MCA distribution CVA. He was on xarelto PTA for DVT.   Large left brain CVA  [facility]  The patient is worse today. The patient is working to breath, and he will not follow commands well, hematuria noted.   TREATMENT/PLAN  The patient is back on Xarelto, discontinue aspirin.  Carotid doppler is pending done today  ST eval for swallow  PT and OT eval.  Stroke team will follow  CT head today due to lethargy  Consider TEE given recent DVT and CVA R/O PFO. The patient is on Xarelto, the patient likely has a PFO given history of DVT and bihemispheric embolic infarct. Would consider transcranial Doppler bubble study at some point. The patient is already being anticoagulated, however.  York SpanielCharles K Delmi Fulfer  11/09/2013 9:18 AM

## 2013-11-09 NOTE — Progress Notes (Signed)
PT Cancellation Note  Patient Details Name: Justin Mcdonald MRN: [id number] DOB: Mar 22, 1927   Cancelled Treatment:    Reason Eval/Treat Not Completed: Patient at procedure or test/unavailable. Pt receiving echo currently   [name] B [name] 11/09/2013, 9:10 AM [name], PT [phone]

## 2013-11-09 NOTE — Progress Notes (Signed)
[facility]  Comer Locketvo Acre ZOX:[id number] DOB: 1926/08/15 DOA: 02-13-14 PCP: Lyndon CodeKHAN, FOZIA M, MD  Time spent : 35mins  Brief narrative: 80 y.o. male with a past medical history significant for recently diagnosed DVT on xarelto, brought in due to AMS.Patient was not able to provide reliable information and thus all history is obtained form the chart. He was recently admitted in February at [facility], at which time he was found to have DVT and from there he was sent to rehabilitation facility from where he was discharged 3-4 weeks ago. Patient had been working with home physical therapy.On the date of admission after returning from physical therapy the patient was found nonresponsive and confused and therefore family called EMS.   Patient's EKG was read by EMS as ST elevation and therefore code STEMI was called. He was febrile (101.4 with leukocytosis 14,000) with tachypnea and tachycardia upon arrival and there was also a concern for sepsis.   Of note, patient's wife reported that couple of days prior to arrival he pulled a muscle on his right side and because of which he had been having difficulty moving his right side. Upon arrival to ED he had a CT brain that did not show acute intracranial abnormality. Because the weakness symptoms appeared to be chronic in nature the code stroke was also cancelled. CT angio in the ED showed no new PE.  He was subsequently evaluated by neurology who felt the pt's symptoms warranted additional evaluation. Subsequent MRI/MRA revealed a large left MCA distribution CVA. In setting of recent use of Xarelto for DVT.   HPI/Subjective: Pt awakens and upon our exam earlier this am he appeared to be in a neurogenic respiratory pattern, he was essentially non verbal and did not open his eyes to voice or tactile stimulation  Assessment/Plan:    Sepsis/Bacteremia due to Gram-positive bacteria -blood cx's obtained at  admit positive 2/2 for enterococcus -Neuro concerned for ? PFO given presentation with embolic CVA (septic vs DVT etiology vs both) -ID consulted: recommend TEE since TTE not helpful but TEE could not exclude prior vegetations embolized to brain therefore recommendation is to treat empirically for infectious endocarditis -ID adjusting anbx's to ampicillin and gentamicin    Embolic cerebral infarction -either septic or DVT etiology (?? PFO) -Neuro following -carotid duplex neg -PT/OT/SLP eval -CT head pending 4/13 due to lethargy -Neuro also considering transcranial Doppler bubble study at some point     DVT (deep venous thrombosis) -know diagnosis pre admit -cont Xarelto for now unless bleeding     Chronic diastolic CHF (congestive heart failure), NYHA class 1 -appears compensated at this time -keep BP controlled-started on low dose carvedilol this admission -CXR at admit c/w shallow resp effort    Hematuria -likely from foley trauma since was clear at admit UA -RN has flushed catheter since noted with clots and now seems to be draining well    Acute respiratory failure with hypoxia -seems more due to neurogenic pattern - follow w/ venturi mask (pt is NCB)    Hypokalemia -replete prn   DVT prophylaxis: Xarelto Code Status: DO NOT RESUSCITATE Family Communication: No family at [facility]-wife Elizabeth Foote ([phone])-call prn since she is unable to travel to hospital Disposition Plan/Expected LOS: Step down  Consultants: Neurology Infectious disease  Procedures: 2-D echocardiogram - Left ventricle: The cavity size was normal. Wall thickness was increased in a pattern of mild LVH. There was mild focal basal hypertrophy of  the septum. Systolic function was normal. The estimated ejection fraction was in the range of 60% to 65%. Wall motion was normal; there were no regional wall motion abnormalities. Doppler parameters are consistent with abnormal left ventricular  relaxation (grade 1 diastolic dysfunction). - Ascending aorta: The ascending aorta was mildly dilated. - Mitral valve: Mild regurgitation. - Left atrium: The atrium was moderately dilated. - Pericardium, extracardiac: A trivial pericardial effusion was identified.  Carotid duplex Preliminary results normal  Antibiotics: Ampicillin 4/10 >>> Zosyn 4/10 >>> 4/12 Vancomycin 4/10 >>> 4/13 Gentamicin 4/13 >>>  Objective: Blood pressure 126/80, pulse 97, temperature 98.9 F (37.2 C), temperature source Axillary, resp. rate 32, height 5\' 8"  (1.727 m), weight 192 lb 3.9 oz (87.2 kg), SpO2 100.00%.  Intake/Output Summary (Last 24 hours) at 11/09/13 1348 Last data filed at 11/09/13 0900  Gross per 24 hour  Intake    503 ml  Output    825 ml  Net   -322 ml   Exam: General: Appears toxic with a neurogenic respiratory pattern Lungs: Clear to auscultation bilaterally without wheezes or crackles, 98% Ventimask; tachypnea Cardiovascular: Regular rate and rhythm without murmur gallop or rub normal S1 and S2, no peripheral edema or JVD Abdomen: Nontender, nondistended, soft, bowel sounds positive, no rebound, no ascites, no appreciable mass Musculoskeletal: No significant cyanosis, clubbing of bilateral lower extremities Neurological: Unable to fully awakened, unable to appreciate if any focal deficits since patient not able to follow commands at this time, appears to have a deep neurogenic respiratory pattern similar to Kussmal's  Scheduled Meds:  Scheduled Meds: . ampicillin (OMNIPEN) IV  2 g Intravenous 6 times per day  . antiseptic oral rinse  15 mL Mouth Rinse q12n4p  . carvedilol  3.125 mg Oral BID WC  . chlorhexidine  15 mL Mouth Rinse BID  . gentamicin  90 mg Intravenous Q8H  . ipratropium-albuterol  3 mL Nebulization TID  . QUEtiapine  25 mg Oral QHS  . rivaroxaban  20 mg Oral Q supper  . sodium chloride  3 mL Intravenous Q12H   Data Reviewed: Basic Metabolic Panel:  Recent  Labs Lab 11/25/2013 2100 10/28/2013 2135 11/07/13 0912 11/08/13 0415 11/09/13 0259  NA 134* 132* 136* 139 136*  K 4.2 4.0 3.7 3.4* 3.4*  CL 94* 99 99 103 98  CO2 21  --  22 23 23   GLUCOSE 115* 121* 110* 104* 109*  BUN 19 18 18 17 15   CREATININE 0.68 0.90 0.59 0.56 0.52  CALCIUM 9.3  --  8.7 8.7 8.6  MG  --   --   --  1.9 2.0   Liver Function Tests:  Recent Labs Lab 11/08/2013 2100 11/07/13 0912 11/08/13 0415 11/09/13 0259  AST 29 24 27  34  ALT 18 14 14 16   ALKPHOS 123* 105 102 110  BILITOT 0.7 0.5 0.5 0.6  PROT 7.4 6.2 6.2 6.5  ALBUMIN 2.9* 2.6* 2.4* 2.4*    Recent Labs Lab 11/07/13 0632  AMMONIA 32   CBC:  Recent Labs Lab 11/19/2013 2100 11/08/2013 2135 11/07/13 0912 11/08/13 0415 11/09/13 0259  WBC 14.4*  --  12.1* 9.8 11.0*  NEUTROABS 11.7*  --   --  6.7 7.9*  HGB 12.6* 13.9 10.6* 10.4* 10.7*  HCT 37.5* 41.0 31.4* 31.0* 32.4*  MCV 82.6  --  81.3 82.0 82.2  PLT 247  --  227 251 315   Cardiac Enzymes:  Recent Labs Lab 11/22/2013 2100 11/07/13 0912 11/07/13 1715 11/08/13 0415  TROPONINI <0.30 0.43* <0.30 <0.30   BNP (last 3 results)  Recent Labs  11/07/13 0637  PROBNP 244.9   CBG:  Recent Labs Lab 11/04/2013 2127  GLUCAP 115*    Recent Results (from the past 240 hour(s))  URINE CULTURE     Status: None   Collection Time    11/21/2013 10:03 PM      Result Value Ref Range Status   Specimen Description URINE, CATHETERIZED   Final   Special Requests NONE   Final   Culture  Setup Time     Final   Value: 11/18/2013 22:47     Performed at [facility] Count     Final   Value: NO GROWTH     Performed at [facility]   Culture     Final   Value: NO GROWTH     Performed at [facility]   Report Status 11/08/2013 FINAL   Final  CULTURE, BLOOD (ROUTINE X 2)     Status: None   Collection Time    10/28/2013 10:13 PM      Result Value Ref Range Status   Specimen Description BLOOD RIGHT HAND   Final   Special Requests  BOTTLES DRAWN AEROBIC AND ANAEROBIC 4 CC   Final   Culture  Setup Time     Final   Value: 11/07/2013 03:16     Performed at [facility]   Culture     Final   Value: ENTEROCOCCUS SPECIES     Note: COMBINATION THERAPY OF HIGH DOSE AMPICILLIN OR VANCOMYCIN, PLUS AN AMINOGLYCOSIDE, IS USUALLY INDICATED FOR SERIOUS ENTEROCOCCAL INFECTIONS.     Note: Gram Stain Report Called to,Read Back By and Verified With: [facility] 11/07/13 @ 4:32PM RUSCA.     Performed at [facility]   Report Status 11/09/2013 FINAL   Final   Organism ID, Bacteria ENTEROCOCCUS SPECIES   Final  CULTURE, BLOOD (ROUTINE X 2)     Status: None   Collection Time    11/10/2013 10:17 PM      Result Value Ref Range Status   Specimen Description BLOOD LEFT FOREARM   Final   Special Requests BOTTLES DRAWN AEROBIC ONLY 8 CC   Final   Culture  Setup Time     Final   Value: 11/07/2013 03:16     Performed at [facility]   Culture     Final   Value: ENTEROCOCCUS SPECIES     Note: SUSCEPTIBILITIES PERFORMED ON PREVIOUS CULTURE WITHIN THE LAST 5 DAYS.     Note: Gram Stain Report Called to,Read Back By and Verified With: DOROTHY Quinnton 11/07/13 @ 4:32PM BY RUSCA     Performed at [facility]   Report Status 11/09/2013 FINAL   Final  MRSA PCR SCREENING     Status: None   Collection Time    11/07/13  1:49 AM      Result Value Ref Range Status   MRSA by PCR NEGATIVE  NEGATIVE Final   Comment:            The GeneXpert MRSA Assay (FDA     approved for NASAL specimens     only), is one component of a     comprehensive MRSA colonization     surveillance program. It is not     intended to diagnose MRSA     infection nor to guide or     monitor treatment for  MRSA infections.  CULTURE, BLOOD (ROUTINE X 2)     Status: None   Collection Time    11/08/13  2:47 PM      Result Value Ref Range Status   Specimen Description BLOOD RIGHT FOREARM   Final   Special Requests BOTTLES DRAWN AEROBIC ONLY 10CC  EACH   Final   Culture  Setup Time     Final   Value: 11/08/2013 20:56     [facility]   Culture     Final   Value:        BLOOD CULTURE RECEIVED NO GROWTH TO DATE CULTURE WILL BE HELD FOR 5 DAYS BEFORE ISSUING A FINAL NEGATIVE REPORT     [facility]   Report Status PENDING   Incomplete  CULTURE, BLOOD (ROUTINE X 2)     Status: None   Collection Time    11/08/13  2:47 PM      Result Value Ref Range Status   Specimen Description BLOOD RIGHT HAND   Final   Special Requests BOTTLES DRAWN AEROBIC ONLY 10CC   Final   Culture  Setup Time     Final   Value: 11/08/2013 20:56     [facility]   Culture     Final   Value:        BLOOD CULTURE RECEIVED NO GROWTH TO DATE CULTURE WILL BE HELD FOR 5 DAYS BEFORE ISSUING A FINAL NEGATIVE REPORT     [facility]   Report Status PENDING   Incomplete     Studies:  Recent x-ray studies have been reviewed in detail by the Attending Physician     Junious SilkAllison Ellis, ANP [facility] Office  [phone] Pager [phone]  **If unable to reach the above provider after paging please contact the Flow Manager @ [phone]  On-Call/Text Page:      [email or web address]      password [email or web address]  If 7PM-7AM, please contact night-coverage [email or web address] Password [email or web address] 11/09/2013, 1:48 PM   LOS: 3 days   I have personally examined this patient and reviewed the entire database. I have reviewed the above note, made any necessary editorial changes, and agree with its content.  Lonia BloodJeffrey T. Aashvi Rezabek, MD [facility]

## 2013-11-10 DIAGNOSIS — Z515 Encounter for palliative care: Secondary | ICD-10-CM

## 2013-11-10 DIAGNOSIS — I5032 Chronic diastolic (congestive) heart failure: Secondary | ICD-10-CM

## 2013-11-10 DIAGNOSIS — R319 Hematuria, unspecified: Secondary | ICD-10-CM

## 2013-11-10 DIAGNOSIS — B952 Enterococcus as the cause of diseases classified elsewhere: Secondary | ICD-10-CM

## 2013-11-10 MED ORDER — LORAZEPAM 2 MG/ML IJ SOLN
2.0000 mg | INTRAMUSCULAR | Status: DC
  Administered 2013-11-10 – 2013-11-11 (×6): 2 mg via INTRAVENOUS
  Filled 2013-11-10 (×6): qty 1

## 2013-11-10 MED ORDER — LORAZEPAM 2 MG/ML IJ SOLN
2.0000 mg | INTRAMUSCULAR | Status: AC
  Administered 2013-11-10: 2 mg via INTRAVENOUS
  Filled 2013-11-10: qty 1

## 2013-11-10 MED ORDER — WHITE PETROLATUM GEL
Status: AC
  Administered 2013-11-10: 1
  Filled 2013-11-10: qty 5

## 2013-11-10 MED ORDER — ATROPINE SULFATE 1 % OP SOLN
4.0000 [drp] | OPHTHALMIC | Status: DC
  Administered 2013-11-10 – 2013-11-11 (×6): 4 [drp] via SUBLINGUAL
  Filled 2013-11-10: qty 2

## 2013-11-10 MED ORDER — MORPHINE BOLUS VIA INFUSION
2.0000 mg | INTRAVENOUS | Status: DC | PRN
  Administered 2013-11-10 (×2): 2 mg via INTRAVENOUS
  Filled 2013-11-10: qty 2

## 2013-11-10 NOTE — Consult Note (Signed)
Patient Justin Mcdonald      DOB: [birth year]      AST:[id number]     Consult Note from the Palliative Medicine Team at [facility] Requested by: Erin Hearing, NP   PCP: Lavera Guise, MD Reason for Consultation: GOC, symptom management Phone [phone]  Assessment of patients Current state: Justin Mcdonald is a 80 yo with large left MCA distribution CVA (on Xarelto for DVT), sepsis bacteremia, diastolic CHF. Stroke has left him debilitated and he quickly declined last night. Family is now at bedside as he is actively dying.   I met today with Justin Mcdonald wife, daughter and her significant other, and niece at bedside. They just got through speaking with Dr. Leonie Man. We discussed signs that he is actively dying (relaxation of jaw muscles, agonal respirations, unresponsive). We also discussed what else to look for that he is getting closer to his time of passing (i.e. pauses in respirations, mottling, cold extremeties). They understand and wish to keep him comfortable so we will proceed with full comfort measures.    Goals of Care: 1.  Code Status: DNR   2. Scope of Treatment: 1. Vital Signs: daily 2. Respiratory/Oxygen: for comfort 3. Nutritional Support/Tube Feeds: no 4. Antibiotics: no 5. Blood Products: no 6. IVF: no 7. Review of Medications to be discontinued: minimize for comfort 8. Labs: no 9. Telemetry: no 10. Consults: Spiritual care, case management   4. Disposition: Anticipate hospital death. Family has been given information on donating his body for science as requested. Thanks to case management and nursing for helping with this.    3. Symptom Management:   1. Anxiety/Agitation: Ativan scheduled.  2. Pain: Morphine continuous infusion with bolus prn.  3. Fever: Acetaminophen prn.  4. Nausea/Vomiting: Ondansetron prn.  5. Terminal Secretions: Scopolamine in place. Atropine SL scheduled.   4. Psychosocial: Emotional support provided to patient and  family.   5. Spiritual: Chaplain following.    Brief HPI: 80 yo with large left MCA distribution CVA (on Xarelto for DVT), sepsis bacteremia, diastolic CHF. Stroke has left him debilitated and he quickly declined last night. Family is now at bedside as he is actively dying.    ROS: Unable to elicit - unresponsive    PMH:  Past Medical History  Diagnosis Date  . Varicose vein of leg      PSH: Past Surgical History  Procedure Laterality Date  . Fracture surgery  294    leg  . Hernia repair  1996   I have reviewed the FH and SH and  If appropriate update it with new information. No Known Allergies Scheduled Meds: . atropine  4 drop Sublingual Q4H  . ipratropium-albuterol  3 mL Nebulization TID  . LORazepam  2 mg Intravenous Q4H  . LORazepam  2 mg Intravenous NOW  . scopolamine  1 patch Transdermal Q72H   Continuous Infusions: . morphine 10 mg/hr (11/10/13 0534)   PRN Meds:.acetaminophen, morphine, ondansetron (ZOFRAN) IV    BP 92/59  Pulse 142  Temp(Src) 100.3 F (37.9 C) (Axillary)  Resp 17  Ht $R'5\' 8"'OD$  (1.727 m)  Wt 87.2 kg (192 lb 3.9 oz)  BMI 29.24 kg/m2  SpO2 100%   PPS: 10%   Intake/Output Summary (Last 24 hours) at 11/10/13 1018 Last data filed at 11/10/13 0600  Gross per 24 hour  Intake 1130.12 ml  Output    425 ml  Net 705.12 ml    Physical Exam:  General: Ill appearing, actively  dying HEENT: [place]/AT, no JVD, dry mucous membranes Chest: Coarse throughout, tachypneic, neurogenic breathing pattern CVS: RRR, S1 S2 Abdomen: Soft, NT, slightly distended, obese, hypoactive BS Ext: Edema in BLE, cool to touch Neuro: Unresponsive, unable to follow commands  Labs: CBC    Component Value Date/Time   WBC 11.0* 11/09/2013 0259   WBC 6.3 02/05/2006 1230   RBC 3.94* 11/09/2013 0259   RBC 5.10 02/05/2006 1230   HGB 10.7* 11/09/2013 0259   HGB 14.8 02/05/2006 1230   HCT 32.4* 11/09/2013 0259   HCT 45.8 02/05/2006 1230   PLT 315 11/09/2013 0259   PLT 248  02/05/2006 1230   MCV 82.2 11/09/2013 0259   MCV 90 02/05/2006 1230   MCH 27.2 11/09/2013 0259   MCH 28.4 02/05/2006 1230   MCHC 33.0 11/09/2013 0259   MCHC 31.7* 02/05/2006 1230   RDW 14.6 11/09/2013 0259   RDW 12.1 02/05/2006 1230   LYMPHSABS 1.5 11/09/2013 0259   LYMPHSABS 2.1 02/05/2006 1230   MONOABS 1.5* 11/09/2013 0259   EOSABS 0.0 11/09/2013 0259   EOSABS 0.1 02/05/2006 1230   BASOSABS 0.0 11/09/2013 0259   BASOSABS 0.1 02/05/2006 1230    BMET    Component Value Date/Time   NA 136* 11/09/2013 0259   K 3.4* 11/09/2013 0259   CL 98 11/09/2013 0259   CO2 23 11/09/2013 0259   GLUCOSE 109* 11/09/2013 0259   BUN 15 11/09/2013 0259   CREATININE 0.52 11/09/2013 0259   CALCIUM 8.6 11/09/2013 0259   GFRNONAA >90 11/09/2013 0259   GFRAA >90 11/09/2013 0259    CMP     Component Value Date/Time   NA 136* 11/09/2013 0259   K 3.4* 11/09/2013 0259   CL 98 11/09/2013 0259   CO2 23 11/09/2013 0259   GLUCOSE 109* 11/09/2013 0259   BUN 15 11/09/2013 0259   CREATININE 0.52 11/09/2013 0259   CALCIUM 8.6 11/09/2013 0259   PROT 6.5 11/09/2013 0259   ALBUMIN 2.4* 11/09/2013 0259   AST 34 11/09/2013 0259   ALT 16 11/09/2013 0259   ALKPHOS 110 11/09/2013 0259   BILITOT 0.6 11/09/2013 0259   GFRNONAA >90 11/09/2013 0259   GFRAA >90 11/09/2013 0259     Time In Time Out Total Time Spent with Patient Total Overall Time  0945 1030 69min 80min    Greater than 50%  of this time was spent counseling and coordinating care related to the above assessment and plan.  Vinie Sill, NP Palliative Medicine Team Pager # [phone] (M-F 8a-5p) Team Phone # [phone] (Nights/Weekends)

## 2013-11-10 NOTE — Care Management Note (Signed)
    Page 1 of 1   11/10/2013     3:41:53 PM   CARE MANAGEMENT NOTE 11/10/2013  Patient:  Justin Mcdonald,Justin Mcdonald   Account Number:  [id number]  Date Initiated:  11/10/2013  Documentation initiated by:  Verdis PrimeMAYO,Amely Voorheis  Subjective/Objective Assessment:   dx sepsis; lives with spouse    PCP  Dr Beverely RisenFozia Khan      DC Planning Services  CM consult      Per UR Regulation:  Reviewed for med. necessity/level of care/duration of stay  Comments:  11/10/13 1510 Karina Nofsinger RN MSN BSN CCM Pt is now on MS04 drip, full comfort care.  Family interested in donation of body, CM provided contact information for [employer].  Application was faxed to family and they will complete.

## 2013-11-10 NOTE — Progress Notes (Signed)
[facility]  Patient Status: Active (receiving services up to time of hospitalization)  [facility] is providing the following services: RN, PT and OT  If patient discharges after hours, please call [phone].   Justin BourgeoisMarie Mcdonald 11/10/2013, 5:23 PM

## 2013-11-10 NOTE — Progress Notes (Signed)
[facility]  Justin Mcdonald WUJ:[id number] DOB: 03-20-1927 DOA: Apr 21, 2014 PCP: Lyndon CodeKHAN, FOZIA M, MD  Time spent : 35mins  Brief narrative: 80 y.o. male with a past medical history significant for recently diagnosed DVT on xarelto, brought in due to AMS.Patient was not able to provide reliable information and thus all history is obtained form the chart. He was recently admitted in February at [facility], at which time he was found to have DVT and from there he was sent to rehabilitation facility from where he was discharged 3-4 weeks ago. Patient had been working with home physical therapy.On the date of admission after returning from physical therapy the patient was found nonresponsive and confused and therefore family called EMS.   Patient's EKG was read by EMS as ST elevation and therefore code STEMI was called. He was febrile (101.4 with leukocytosis 14,000) with tachypnea and tachycardia upon arrival and there was also a concern for sepsis.   Of note, patient's wife reported that couple of days prior to arrival he pulled a muscle on his right side and because of which he had been having difficulty moving his right side. Upon arrival to ED he had a CT brain that did not show acute intracranial abnormality. Because the weakness symptoms appeared to be chronic in nature the code stroke was also cancelled. CT angio in the ED showed no new PE.  He was subsequently evaluated by neurology who felt the pt's symptoms warranted additional evaluation. Subsequent MRI/MRA revealed a large left MCA distribution CVA. In setting of recent use of Xarelto for DVT.   HPI/Subjective: Pt awakens and upon our exam earlier this am he appeared to be in a neurogenic respiratory pattern, he was essentially non verbal and did not open his eyes to voice or tactile stimulation  Assessment/Plan:    Sepsis/Bacteremia due to Gram-positive bacteria -blood cx's obtained at  admit positive 2/2 for enterococcus -Neuro concerned for ? PFO given presentation with embolic CVA (septic vs DVT etiology vs both) -ID consulted: recommend TEE since TTE not helpful but TEE could not exclude prior vegetations embolized to brain therefore recommendation was to treat empirically for infectious endocarditis but now focus is on comfort measures-Morphine gtt initiated and being titrated by RN -ID was adjusting anbx's (ampicillin and gentamicin) but these meds have been dc'd    Embolic cerebral infarction -suspected either septic or DVT etiology (?? PFO) -Neuro has s/o'd since now comfort care -carotid duplex neg -CT head pending 4/13 due to lethargy showed no new findings but still extensive insult     DVT (deep venous thrombosis) -know diagnosis pre admit    Chronic diastolic CHF (congestive heart failure), NYHA class 1 -compensated at this time -plan was to keep BP controlled- so was started on low dose carvedilol this admission but all non comfort meds have been dc'd -CXR at admit c/w shallow resp effort    Hematuria -likely from foley trauma since was clear at admit UA -RN has flushed catheter since noted with clots and now seems to be draining well -very low UOP in setting of pt actively dying    Acute respiratory failure with hypoxia -more due to neurogenic pattern - follow w/ venturi mask (pt is NCB) for comnfort    Hypokalemia   DVT prophylaxis: Xarelto Code Status: DO NOT RESUSCITATE Family Communication:[facility]  [facility]wife Justin Mcdonald [phone]) had just stepped out but would like Palliative to assist with  emotional support and other EOL resources-actively dying so anticipate death soon Disposition Plan/Expected LOS: Step down  Consultants: Neurology Infectious disease  Procedures: 2-D echocardiogram - Left ventricle: The cavity size was normal. Wall thickness was increased in a pattern of mild LVH. There was mild focal basal  hypertrophy of the septum. Systolic function was normal. The estimated ejection fraction was in the range of 60% to 65%. Wall motion was normal; there were no regional wall motion abnormalities. Doppler parameters are consistent with abnormal left ventricular relaxation (grade 1 diastolic dysfunction). - Ascending aorta: The ascending aorta was mildly dilated. - Mitral valve: Mild regurgitation. - Left atrium: The atrium was moderately dilated. - Pericardium, extracardiac: A trivial pericardial effusion was identified.  Carotid duplex Preliminary results normal  Antibiotics: Ampicillin 4/10 >>> Zosyn 4/10 >>> 4/12 Vancomycin 4/10 >>> 4/13 Gentamicin 4/13 >>>  Objective: Blood pressure 92/59, pulse 142, temperature 100.3 F (37.9 C), temperature source Axillary, resp. rate 17, height 5\' 8"  (1.727 m), weight 192 lb 3.9 oz (87.2 kg), SpO2 100.00%.  Intake/Output Summary (Last 24 hours) at 11/10/13 1036 Last data filed at 11/10/13 0600  Gross per 24 hour  Intake 1130.12 ml  Output    425 ml  Net 705.12 ml   Exam: General: Appears toxic with a neurogenic respiratory pattern Lungs: Coarse to auscultation bilaterally, 100% NRB mask; tachypnea Cardiovascular: Regular tachycardic rate and rhythm without murmur gallop or rub normal S1 and S2, no peripheral edema or JVD Abdomen: Nontender, nondistended, soft, bowel sounds positive, no rebound, no ascites, no appreciable mass Musculoskeletal: No significant cyanosis, clubbing of bilateral lower extremities Neurological: Unresponsive, appears to have a deep neurogenic respiratory pattern similar to Kussmal's -on morphine drip  Scheduled Meds:  Scheduled Meds: . atropine  4 drop Sublingual Q4H  . ipratropium-albuterol  3 mL Nebulization TID  . LORazepam  2 mg Intravenous Q4H  . LORazepam  2 mg Intravenous NOW  . scopolamine  1 patch Transdermal Q72H   Data Reviewed: Basic Metabolic Panel:  Recent Labs Lab 10/29/2013 2100  11/02/2013 2135 11/07/13 0912 11/08/13 0415 11/09/13 0259  NA 134* 132* 136* 139 136*  K 4.2 4.0 3.7 3.4* 3.4*  CL 94* 99 99 103 98  CO2 21  --  22 23 23   GLUCOSE 115* 121* 110* 104* 109*  BUN 19 18 18 17 15   CREATININE 0.68 0.90 0.59 0.56 0.52  CALCIUM 9.3  --  8.7 8.7 8.6  MG  --   --   --  1.9 2.0   Liver Function Tests:  Recent Labs Lab 11/19/2013 2100 11/07/13 0912 11/08/13 0415 11/09/13 0259  AST 29 24 27  34  ALT 18 14 14 16   ALKPHOS 123* 105 102 110  BILITOT 0.7 0.5 0.5 0.6  PROT 7.4 6.2 6.2 6.5  ALBUMIN 2.9* 2.6* 2.4* 2.4*    Recent Labs Lab 11/07/13 0632  AMMONIA 32   CBC:  Recent Labs Lab 10/30/2013 2100 11/07/2013 2135 11/07/13 0912 11/08/13 0415 11/09/13 0259  WBC 14.4*  --  12.1* 9.8 11.0*  NEUTROABS 11.7*  --   --  6.7 7.9*  HGB 12.6* 13.9 10.6* 10.4* 10.7*  HCT 37.5* 41.0 31.4* 31.0* 32.4*  MCV 82.6  --  81.3 82.0 82.2  PLT 247  --  227 251 315   Cardiac Enzymes:  Recent Labs Lab 11/10/2013 2100 11/07/13 0912 11/07/13 1715 11/08/13 0415  TROPONINI <0.30 0.43* <0.30 <0.30   BNP (last 3 results)  Recent Labs  11/07/13 1610  PROBNP 244.9   CBG:  Recent Labs Lab 11/10/2013 2127  GLUCAP 115*    Recent Results (from the past 240 hour(s))  URINE CULTURE     Status: None   Collection Time    11/26/2013 10:03 PM      Result Value Ref Range Status   Specimen Description URINE, CATHETERIZED   Final   Special Requests NONE   Final   Culture  Setup Time     Final   Value: 11/15/2013 22:47     Performed at [facility] Count     Final   Value: NO GROWTH     Performed at [facility]   Culture     Final   Value: NO GROWTH     Performed at [facility]   Report Status 11/08/2013 FINAL   Final  CULTURE, BLOOD (ROUTINE X 2)     Status: None   Collection Time    11/13/2013 10:13 PM      Result Value Ref Range Status   Specimen Description BLOOD RIGHT HAND   Final   Special Requests BOTTLES DRAWN AEROBIC  AND ANAEROBIC 4 CC   Final   Culture  Setup Time     Final   Value: 11/07/2013 03:16     Performed at [facility]   Culture     Final   Value: ENTEROCOCCUS SPECIES     Note: COMBINATION THERAPY OF HIGH DOSE AMPICILLIN OR VANCOMYCIN, PLUS AN AMINOGLYCOSIDE, IS USUALLY INDICATED FOR SERIOUS ENTEROCOCCAL INFECTIONS.     Note: Gram Stain Report Called to,Read Back By and Verified With: Justin Mcdonald RUSCA.     Performed at [facility]   Report Status 11/09/2013 FINAL   Final   Organism ID, Bacteria ENTEROCOCCUS SPECIES   Final  CULTURE, BLOOD (ROUTINE X 2)     Status: None   Collection Time    11/15/2013 10:17 PM      Result Value Ref Range Status   Specimen Description BLOOD LEFT FOREARM   Final   Special Requests BOTTLES DRAWN AEROBIC ONLY 8 CC   Final   Culture  Setup Time     Final   Value: 11/07/2013 03:16     Performed at [facility]   Culture     Final   Value: ENTEROCOCCUS SPECIES     Note: SUSCEPTIBILITIES PERFORMED ON PREVIOUS CULTURE WITHIN THE LAST 5 DAYS.     Note: Gram Stain Report Called to,Read Back By and Verified With: Justin Mcdonald 11/07/13 @ Mcdonald BY RUSCA     Performed at [facility]   Report Status 11/09/2013 FINAL   Final  MRSA PCR SCREENING     Status: None   Collection Time    11/07/13  1:49 AM      Result Value Ref Range Status   MRSA by PCR NEGATIVE  NEGATIVE Final   Comment:            The GeneXpert MRSA Assay (FDA     approved for NASAL specimens     only), is one component of a     comprehensive MRSA colonization     surveillance program. It is not     intended to diagnose MRSA     infection nor to guide or     monitor treatment for     MRSA infections.  CULTURE, BLOOD (ROUTINE X 2)     Status: None  Collection Time    11/08/13  2:47 PM      Result Value Ref Range Status   Specimen Description BLOOD RIGHT FOREARM   Final   Special Requests BOTTLES DRAWN AEROBIC ONLY 10CC EACH   Final   Culture   Setup Time     Final   Value: 11/08/2013 20:56     [facility]   Culture     Final   Value:        BLOOD CULTURE RECEIVED NO GROWTH TO DATE CULTURE WILL BE HELD FOR 5 DAYS BEFORE ISSUING A FINAL NEGATIVE REPORT     Performed at [facility]   Report Status PENDING   Incomplete  CULTURE, BLOOD (ROUTINE X 2)     Status: None   Collection Time    11/08/13  2:47 PM      Result Value Ref Range Status   Specimen Description BLOOD RIGHT HAND   Final   Special Requests BOTTLES DRAWN AEROBIC ONLY 10CC   Final   Culture  Setup Time     Final   Value: 11/08/2013 20:56     [facility]formed at [facility]   Culture     Final   Value:        BLOOD CULTURE RECEIVED NO GROWTH TO DATE CULTURE WILL BE HELD FOR 5 DAYS BEFORE ISSUING A FINAL NEGATIVE REPORT     [facility]formed at [facility]   Report Status PENDING   Incomplete     Studies:  Recent x-ray studies have been reviewed in detail by the Attending Physician     Junious SilkAllison Ellis, ANP [facility] Office  [phone] Pager [phone]  **If unable to reach the above provider after paging please contact the Flow Manager @ [phone]  On-Call/Text Page:      [email or web address]      password [email or web address]  If 7PM-7AM, please contact night-coverage [email or web address] Password [email or web address] 11/10/2013, 10:36 AM   LOS: 4 days   -Examining patient and agree with exam, and assessment and plan of ANP Revonda StandardAllison -Family at bedside addressed all their questions and concerns about maintaining patient and comfort.

## 2013-11-10 NOTE — Progress Notes (Signed)
Chaplain was paged to meet with family.  Pt is on comfort care and non-responsive, and struggling with breathing.  Chaplain offered empathetic listening and emotional support.  Family is secure about the decision for Comfort Care, but are seeking support throughout this process.  Chaplain offered spiritual support with sacred text and a time of prayer.  Chaplain is available if further assistance is desired.    11/10/13 0000  Clinical Encounter Type  Visited With Patient and family together;Health care provider  Visit Type Initial;Patient actively dying;Spiritual support  Referral From Nurse  Spiritual Encounters  Spiritual Needs Prayer;Emotional;Grief support  Stress Factors  Patient Stress Factors None identified  Family Stress Factors Health changes;Major life changes   [place] Deundre Thong, [address]haplain

## 2013-11-10 NOTE — Progress Notes (Signed)
This was a follow up referral visit from on call Chaplain who thought that an additional visit would be helpful. Patient is actively dying. Family expressed appreciation for on call Chaplain's visit and care. Offered continued spiritual care services as needed.  11/10/13 1100  Clinical Encounter Type  Visited With Patient and family together;Health care provider  Visit Type Follow-up;Spiritual support;Patient actively dying  Referral From Chaplain  Spiritual Encounters  Spiritual Needs Emotional  Stress Factors  Patient Stress Factors None identified  Family Stress Factors Health changes  Mathis DadRay L Christpoher Sievers, Chaplain,pager [phone]

## 2013-11-10 NOTE — Progress Notes (Signed)
Stroke Team Progress Note  HISTORY  Justin Mcdonald is an 80 y.o. male with a past medical history significant for recently diagnosed DVT on xarelto, brought in due to AMS, right hemiparesis, slurred speech, and left gaze preference. Patient was not able to provide reliable information and thus all history was obtained from the chart which indicated that he was recently admitted in February at [facility], at which time he was found to have DVT and from there he was sent to rehabilitation facility from where he was discharged 3-4 weeks ago. Patient had been working with home physical therapy. On the day of admission, 11/07/2013, after returning from physical therapy the patient was found nonresponsive and confused and therefore they called EMS. Patient's EKG was read by EMS as ST elevation and therefore code STEMI was called.  He was febrile with increase breathing and tachycardia and elevated white count upon arrival and there was also a concern for sepsis.  Of note, patient's wife reported that a couple of days ago he pulled a muscle on the right side because of which he had been having difficulty moving his right side.  Upon arrival to ED he had a CT brain that did not show acute intracranial abnormality.   Date last known well: 11/07/13  Time last known well: no completely clear, perhaps 1945pm  tPA Given: no, on xarelto  NIHSS: 18  MRS: 3   SUBJECTIVE There were multiple family members in the room. The patient was unresponsive to deep painful stimuli. He had labored respirations despite oxygen mask. Dr. Pearlean BrownieSethi explained that the prognosis was very poor and discussed comfort care measures with the family.The family agreed that this was appropriate. They would contact out of town family members.  OBJECTIVE Most recent Vital Signs: Filed Vitals:   11/09/13 2100 11/09/13 2200 11/10/13 0015 11/10/13 0400  BP:  92/59    Pulse: 147 129 143 142  Temp:      TempSrc:      Resp: 35 30 24  17   Height:      Weight:      SpO2: 98% 100% 100% 100%   CBG (last 3)  No results found for this basename: GLUCAP,  in the last 72 hours  IV Fluid Intake:   . morphine 10 mg/hr (11/10/13 0534)    MEDICATIONS  . ipratropium-albuterol  3 mL Nebulization TID  . LORazepam  1 mg Intravenous Q4H  . scopolamine  1 patch Transdermal Q72H   PRN:  acetaminophen, ondansetron (ZOFRAN) IV  Diet:  NPO  Activity:  Bedrest DVT Prophylaxis:  SCD  CLINICALLY SIGNIFICANT STUDIES Basic Metabolic Panel:   Recent Labs Lab 11/08/13 0415 11/09/13 0259  NA 139 136*  K 3.4* 3.4*  CL 103 98  CO2 23 23  GLUCOSE 104* 109*  BUN 17 15  CREATININE 0.56 0.52  CALCIUM 8.7 8.6  MG 1.9 2.0   Liver Function Tests:   Recent Labs Lab 11/08/13 0415 11/09/13 0259  AST 27 34  ALT 14 16  ALKPHOS 102 110  BILITOT 0.5 0.6  PROT 6.2 6.5  ALBUMIN 2.4* 2.4*   CBC:   Recent Labs Lab 11/08/13 0415 11/09/13 0259  WBC 9.8 11.0*  NEUTROABS 6.7 7.9*  HGB 10.4* 10.7*  HCT 31.0* 32.4*  MCV 82.0 82.2  PLT 251 315   Coagulation:   Recent Labs Lab 01-16-14 2100 11/07/13 0912  LABPROT 20.4* 18.7*  INR 1.80* 1.61*   Cardiac Enzymes:   Recent Labs Lab  11/07/13 0912 11/07/13 1715 11/08/13 0415  TROPONINI 0.43* <0.30 <0.30   Urinalysis:   Recent Labs Lab 2013/09/29 2203  COLORURINE AMBER*  LABSPEC 1.037*  PHURINE 6.0  GLUCOSEU NEGATIVE  HGBUR NEGATIVE  BILIRUBINUR SMALL*  KETONESUR 15*  PROTEINUR NEGATIVE  UROBILINOGEN 1.0  NITRITE NEGATIVE  LEUKOCYTESUR NEGATIVE   Lipid Panel    Component Value Date/Time   CHOL 100 11/08/2013 0415   HgbA1C  No results found for this basename: HGBA1C    Urine Drug Screen:     Component Value Date/Time   LABOPIA NONE DETECTED 2014-01-18 2203   COCAINSCRNUR NONE DETECTED 2014-01-18 2203   LABBENZ NONE DETECTED 2014-01-18 2203   AMPHETMU NONE DETECTED 2014-01-18 2203   THCU NONE DETECTED 2014-01-18 2203   LABBARB NONE DETECTED 2014-01-18 2203     Alcohol Level:   Recent Labs Lab 2013/09/29 2100  ETH <11    Ct Head Wo Contrast 11/09/2013    1. Expected evolution of a large left MCA territory infarct without expansion of the territory. 2. Much smaller posterior right frontal lobe infarct is also without hemorrhage. 3. There is mass effect with partial effacement of the left lateral ventricle but no midline shift. 4. Otherwise stable atrophy and diffuse white matter disease.      CT of the brain   IMPRESSION:  No acute intracranial abnormalities. Chronic atrophy and small  vessel ischemic changes.   MRI of the brain   IMPRESSION:  MRI HEAD:  Large left hemispheric infarct the as detailed above.  Small infarct posterior right frontal operculum region.  Tiny infarct superior left cerebellum.  No intracranial hemorrhage.  Findings raise possibility of embolic disease.  Cervical spondylotic changes with spinal stenosis and cord  flattening C3-4 level.   MRA of the brain   MRA HEAD :  Abrupt occlusion of the proximal M1 segment of the left middle  cerebral artery. Findings suggestive of embolus with nonvisualized  middle cerebral artery branches.  2D Echocardiogram   Study Conclusions  - Left ventricle: The cavity size was normal. Wall thickness was increased in a pattern of mild LVH. There was mild focal basal hypertrophy of the septum. Systolic function was normal. The estimated ejection fraction was in the range of 60% to 65%. Wall motion was normal; there were no regional wall motion abnormalities. Doppler parameters are consistent with abnormal left ventricular relaxation (grade 1 diastolic dysfunction). - Ascending aorta: The ascending aorta was mildly dilated. - Mitral valve: Mild regurgitation. - Left atrium: The atrium was moderately dilated. - Pericardium, extracardiac: A trivial pericardial effusion was identified. Impressions:  - Technically difficult; normal LV function; proximal septal thickening  with turbulence in LVOT; no significant gradient by doppler.   Carotid Doppler  Bilateral: 1-39% ICA stenosis. Vertebral artery flow is antegrade. Right: ECA is not insonated.   CXR   IMPRESSION:  Shallow lung inflation. No acute cardiopulmonary abnormality.  EKG  Sinus tachycardia, HR of 121  Therapy Recommendations Pending  Physical Exam  General: The patient is sleepy, can be aroused.  Skin: No significant peripheral edema is noted.   Neurologic Exam  Mental status: The patient is mute, not following pure verbal commands.  Cranial nerves: Facial symmetry is not present. There is a depression of the right NLF. The patient is mute, not following pure verbal commands, eyes are deviated to the left, no blink to threat from the right, will blink from the left.  Motor: The patient has good strength in left extremities, will hold  the left arm up.The patient does not move the right arm or leg.  Sensory examination: difficult to assess, seems to respond to pain on the left side, less well on the right side.  Coordination: The patient is unable to follow commands to perform cerebellar testing.  Gait and station: The gait was not tested.  Reflexes: Deep tendon reflexes are symmetric, but are depressed. There is a right babinski.    ASSESSMENT Justin Mcdonald is a 80 y.o. male presenting with a right hemiparesis and mutism. The patient likely has a large left MCA distribution CVA. He was on xarelto PTA for DVT.   Large left brain CVA  DVT on [facility] day # 4  TREATMENT/PLAN    I had a long d/w multiple family members about poor prognosis and answered questions.  Stroke team will sign off. Please call for further assistance.  Palliative care to see patient.  Delton See PA-C [facility] Pager [phone] 11/10/2013, 8:02 AM  I have personally examined this patient, reviewed the chart and pertinent data, Debra plan of care and agrees with  above Delia Heady, MD

## 2013-11-11 MED ORDER — IPRATROPIUM-ALBUTEROL 0.5-2.5 (3) MG/3ML IN SOLN: 3.0000 mL | RESPIRATORY_TRACT | Status: DC | PRN

## 2013-11-14 LAB — CULTURE, BLOOD (ROUTINE X 2)
CULTURE: NO GROWTH
Culture: NO GROWTH

## 2013-11-16 NOTE — Consult Note (Signed)
I have reviewed and discussed the care of this patient in detail with the nurse practitioner including pertinent patient records, physical exam findings and data. I agree with details of this encounter.  

## 2013-11-27 NOTE — Progress Notes (Signed)
Pt is resting, non-responsive, non-verbal. Pt is on comfort measures. Pt's family at bedside. Completed a partial bath and peri-care on pt. Pt still has a strong heart rate at 115. Pt's radial pulses are faint. Pt's family has no requests this am. Called kitchen this am for them to replenish the comfort cart outside pt's room and this was completed. Family was notified. Will monitor.

## 2013-11-27 NOTE — Discharge Summary (Signed)
Death Summary   Justin Mcdonald ZOX:[id number] DOB: 1926-12-17 DOA: 11/25/2013  PCP: Justin CodeKHAN, FOZIA M, MD  Admit date: 11/21/2013 Date of Death: 11/21/2013  Final Diagnoses:    Sepsis - Enterococcus Bacteremia   Embolic cerebral infarction - felt to be septic vs DVT/PFO related   DVT (deep venous thrombosis) - present at admission   Chronic diastolic CHF (congestive heart failure), NYHA class 1   Hematuria   Acute respiratory failure with hypoxia   Hypokalemia   Palliative care encounter  History of present illness:  80 y.o. male with a past medical history significant for recently diagnosed DVT on xarelto, brought in due to AMS. He had been recently admitted in February at [facility], at which time he was found to have DVT and from there he was sent to rehabilitation facility, from whence he was discharged 3-4 weeks prior to this admission. Patient had been working with home physical therapy. On the date of admission the patient was found nonresponsive and confused and therefore family called EMS.   Patient's EKG was read by EMS as ST elevation and therefore code STEMI was called. He was febrile (101.4 with leukocytosis 14,000) with tachypnea and tachycardia upon arrival and there was also a concern for sepsis.   Of note, patient's wife reported that couple of days prior to arrival he pulled a muscle on his right side and because of which he had been having difficulty moving his right side. Upon arrival to ED he had a CT brain that did not show acute intracranial abnormality. Because the weakness symptoms appeared to be chronic in nature the code stroke was also cancelled. CT angio in the ED showed no new PE.   He was subsequently evaluated by Neurology who felt the pt's symptoms warranted additional evaluation. Subsequent MRI/MRA revealed a large left MCA distribution CVA.   Hospital Course:   Sepsis/Bacteremia due to Gram-positive bacteria  -blood cx's obtained at admit  positive 2/2 for enterococcus  -Neuro concerned for ? PFO given presentation with embolic CVA (septic vs DVT etiology vs both)  -ID was consulted: suggested TEE could not exclude prior vegetations embolized to brain therefore recommendation was to treat empirically for infectious endocarditis   -focus shifted to comfort measures on early morning hours of 4/14 - Morphine gtt was initiated  -on 4/15 the patient's blood pressure was quite low (SBP 55) and it was clear he was actively dying. Multiple family members were at the bedside. Unfortunately due to poor health the wife was unable to be at her husband's side. On exam the patient appeared comfortable on Morphine gtt 10 mg/hr. Later that morning the patient peacefully expired.  Embolic cerebral infarction  -suspected either septic or DVT etiology (?? PFO)  -Neuro made the family aware from the time of admission that the prognosis was dire and expectations were that the patient would not survive the hospitalization and death would likely occur quickly -CT head 4/13 was completed due to progressive lethargy showed no new findings but still extensive insult   DVT (deep venous thrombosis)  -know diagnosis pre admit   Chronic diastolic CHF (congestive heart failure), NYHA class 1  -compensated  -plan was to keep BP controlled- so was started on low dose carvedilol this admission but all non comfort meds have been dc'd  -CXR at admit c/w shallow resp effort   Hematuria  -likely from foley trauma since was clear at admit UA  -very low UOP in setting of pt actively  dying   Acute respiratory failure with hypoxia  -more due to neurogenic pattern   Time of death: 11:40 am  Signed:  Russella Darllison L Mcdonald ANP [facility] 10/29/2013, 1:30 PM  I have personally examined this patient and reviewed the entire database. I have reviewed the above note, made any necessary editorial changes, and agree with its content.  Lonia BloodJeffrey T. Julyan Gales, MD [facility]

## 2013-11-27 NOTE — Progress Notes (Signed)
RT Note: Pt is comfort care & has been refusing treatments. Changed to PRN per family request.

## 2013-11-27 NOTE — Progress Notes (Signed)
Patient has expired at 1140 am. Pt's heart rate went down to 30 and then asystole. Javae Braaten RN and Insurance account managerMonica RN at 1140 am verified no heart beat by listening with stethoscope. Pt's family at bedside and tolerating situation well. Paged Dr. Sharon SellerMcClung and Junious SilkAllison Ellis, NP to notify.  Junious SilkAllison Ellis, NP coming to sign death certificate.

## 2013-11-27 DEATH — deceased

## 2014-03-02 ENCOUNTER — Ambulatory Visit: Payer: Self-pay | Admitting: Internal Medicine

## 2014-11-20 NOTE — Discharge Summary (Signed)
PATIENT NAMComer Locket:  Streett, Jassen MR#:  [id number] DATE OF BIRTH:  1927/07/07  DATE OF ADMISSION:  09/17/2013 DATE OF DISCHARGE:  09/21/2013  ADMITTING DIAGNOSIS:   Syncope.   DISCHARGE DIAGNOSES: 1.  Syncope due to orthostatic hypotension.  2.  Orthostatic hypotension due to dehydration.  3.  Chronic obstructive pulmonary disease exacerbation and acute bronchitis.  4.  Hypoxia.  5.  Suspected normal pressure hydrocephalus.  6.  Right lung nodules on CT scan, recommended 6 month followup.  7.  Emphysema.  8. Bilateral lower extremity chronic extensive deep vein thrombosis, status post inferior vena cava filter placement in the remote past, now on Xarelto loading dose.  9.  Generalized weakness.  10.  Constipation.   DISCHARGE CONDITION: Stable.   DISCHARGE MEDICATIONS: The patient is to continue:  1.  Xarelto 15 mg twice daily for 20 days, then change to 20 mg p.o. daily for 3 to 6 months.  2.  Albuterol/ipratropium 1 puff 4 times daily as needed.  3.  Fluticasone/formoterol l 250/50, 1 puff twice daily.  4.  Zithromax 250 mg p.o. once daily for 3 more days.  5.  Colace 100 mg p.o. twice daily.   HOME OXYGEN: Portable tank at 2 liters of oxygen through nasal cannula.   DIET: Regular, mechanical soft.   ACTIVITY LIMITATIONS: As tolerated.   FOLLOWUP: Appointment with Dr. Yves DillNeelam Khan in 2 days after discharge.   Nasal cannula and kind.    Referral to physical therapy 2 to 7 times a week. The patient is being discharged to skilled nursing facility rehabilitation facility for physical therapy.   CONSULTANTS: Care management, social work.   RADIOLOGIC STUDIES: Chest x-ray, portable single view, 09/16/2013, revealed linear densities in left lung base, which are unchanged, most consistent with scar. CT scan of head without contrast, 09/16/2013, revealed no acute intracranial process, moderate global brain atrophy with somewhat disproportion of sulci in effacement of the convexities, which  can be seen with normal pressure hydrocephalus. Moderate to severe white matter changes suggesting chronic small vessel ischemic disease, similar dolichoectasia of the intracranial vessels which may be seen with chronic hypotension, according to radiology. CT scan of carotid arteries, CT angiogram, 09/17/2013, showing mild cervical atherosclerosis without evidence of significant stenosis. Vertebral arteries were patent bilaterally with the left being dominant. CT angiography of chest for pulmonary embolism, 09/17/2013, revealed no evidence of pulmonary embolus. Two right lung nodules measuring up to 5 mm noted, assuming high risk for primary bronchogenic neoplasm. Initial followup CT of chest suggested in 6 to 12 months. Mild paraseptal emphysematous changes as well were noted. Carotid ultrasound, 09/17/2013, revealed inability to detect flow of right vertebral artery, question occlusion. No evidence of significant plaque formation or stenosis. Tortuosity of carotid systems bilaterally were also noted. Bilateral lower extremity Dopplers, 09/18/2013, showed extensive bilateral lower extremity DVT.   The patient is an 81 year old male with past medical history significant for history of tobacco abuse, who presented to the hospital with complaints of syncope. Please refer to Dr. Clarita LeberVasireddy's admission note on 09/16/2013. On arrival to the hospital, the patient was noted to be hypotensive with systolic blood pressure in the 70s. His temperature was normal at 97.4, pulse was 91, respiration rate was 20, O2 sats were 94% on oxygen therapy. Physical exam was unremarkable. The patient's lab data done on admission, 09/16/2013, showed glucose of 113, otherwise BMP was unremarkable. The patient's cardiac enzymes x 3 were within normal limits. TSH was normal at 0.79. White blood cell  count was mildly elevated at 10.7. Hemoglobin was 14.9. Platelet count was 214. Absolute neutrophil count was 6.1. D-dimer was high at more than  6. Urinalysis was remarkable for 3 white blood cells, as well as 3 red blood cells. Urine culture revealed orders 5000 gram-positive cocci and gram-negative rods 1000 colony-forming units.   The patient was admitted to the hospital for further evaluation. His orthostatic vital signs were markedly abnormal with blood pressure dipping down from around 115 to 93 whenever he stood up. It was felt that the patient's syncope was very likely related to orthostatic hypotension. The patient did have carotid ultrasound done, which was somewhat concerning for vertebral abnormalities, so he underwent CTA of vertebral arteries, which was unremarkable. He also had echocardiogram done on the 09/17/2013, which revealed left ventricular ejection fraction by visual estimation of more than 75%, elevated mean left atrial pressure, impaired relaxation pattern with left ventricular diastolic filling, decrease in ventricular internal cavity size, as well as normal right ventricular size and systolic function. Left as well as right atriums were normal in size and structure., Mild mitral valve regurgitation was noted, as well as mild aortic valve regurgitation. Mild aortic valve sclerosis without stenosis noted, as well as mild tricuspid regurgitation. It was felt that the patient did not have any significant heart-related issues at this point.  He was rehydrated and with this, his orthostatic vital signs normalized. He was ambulated; however, due to weakness, it was felt that the patient would benefit from rehabilitation. He was noted to have lower extremity swelling with rehydration, which revealed extensive DVT. The patient was initiated on Xarelto. The patient is to continue Xarelto for the next 20 days to complete loading, and then finish Xarelto 20 mg p.o. daily dose, to continue for 3 to 6 months. The patient did have IVC filter placed in the remote past, as per discussions with the patient's family. While in the hospital, the  patient developed COPD exacerbation. He was initiated on Advair Diskus, as well as nebulizing therapy, and the patient was also initiated on Zithromax, with which this therapy he improved. It is recommended to continue antibiotic therapy for 3 more days to complete course, as well as Advair Diskus, as well as albuterol/ipratropium inhalation. In regards to hypoxia, it was felt the patient's hypoxia was very likely COPD related. The patient is to continue on oxygen therapy for now and wean off oxygen depending on his ability to be weaned off. The patient is being discharged in stable condition with the above-mentioned medications and followup. His vital signs on the day of discharge, temperature was 98.1, pulse was 90, respirations were 18, blood pressure 110/72, O2 saturation is 94% on 2 liters of oxygen through nasal cannula at rest.   TIME SPENT: 40 minutes on this patient.   ____________________________ Katharina Caper, MD rv:dmm D: 09/21/2013 10:16:01 ET T: 09/21/2013 10:59:36 ET JOB#: [id number]  cc: Katharina Caper, MD, <Dictator> Margaretann Loveless, MD Akeem Heppler MD ELECTRONICALLY SIGNED 09/30/2013 21:20

## 2014-11-20 NOTE — H&P (Signed)
PATIENT NAMEGOVANI, Mcdonald MR#:  [id number] DATE OF BIRTH:  02-Nov-1926  DATE OF ADMISSION:  09/16/2013  PRIMARY CARE PHYSICIAN:  Dr. Yves Dill.   REFERRING PHYSICIAN:  Dr. Sharyn Creamer.   CHIEF COMPLAINT:  Syncope.   HISTORY OF PRESENT ILLNESS:  Justin Mcdonald is an 81 year old male with no past medical history, presented to the Emergency Department after having an episode of syncope.  The patient is recovering from cold, still has some wet cough.  The patient has not been moving the last two days, mostly sitting in the recliner.  The patient's wife called him to have dinner, stood up and was walking towards the dining room.  As he reached the dining hall fell down to the floor.  The patient's niece was there at that time, tried to help him to the floor.  This episode again occurred with severe generalized weakness  severe extreme weakness.  The patient had urinary incontinence with tremulousness.  The patient was noted to be hypotensive with a blood pressure of 70.  Concerning this, called EMS and is brought to the Emergency Department.  Work-up in the Emergency Department, CT head without contrast, no acute intracranial abnormality, moderate global atrophy.  Chest x-ray, one view, portable shows linear densities in the left lung base, are unchanged.  Otherwise, the rest of all the labs are unremarkable.  Orthostatic blood pressure is not done.  Initial set of cardiac enzymes are negative.  EKG does not show any ST-T wave abnormalities.   PAST MEDICAL HISTORY:  None.   PAST SURGICAL HISTORY:  Ventral hernia repair, complicated with infected mesh, formation of the fistula.  Last surgery was in 1990.  History of left lower extremity ankle fracture requiring multiple pins and plates, complicated by cellulitis.   ALLERGIES:  PREDNISONE.   HOME MEDICATIONS:  None.   SOCIAL HISTORY:  No history of smoking, drinking alcohol or using illicit drugs.  Married, lives with his wife.  The patient has poor  functional capacity, walks very slowly around the house occasionally.   FAMILY HISTORY:  No obvious health problems run in the family.   REVIEW OF SYSTEMS: CONSTITUTIONAL:  Denies any generalized weakness.  EYES:  No change in vision.  EARS, NOSE, THROAT:  No change in hearing.   RESPIRATORY:  Having cough with productive sputum.  CARDIOVASCULAR:  No chest pain, palpations.   GASTROINTESTINAL:  No nausea, vomiting, abdominal pain.  GENITOURINARY:  No dysuria or hematuria.  ENDOCRINE:  No polyuria or polydipsia.  HEMATOLOGIC:  No easy bruising or bleeding.  SKIN:  No rash or lesions.  MUSCULOSKELETAL:  No joint pains and aches.  NEUROLOGIC:  No weakness or numbness in any part of the body.   PHYSICAL EXAMINATION: GENERAL:  This is a well-built, well-nourished, age-appropriate male lying down in the bed, not in distress.  VITAL SIGNS:  Temperature 97.4, pulse 91, blood pressure 109/76, respiratory rate of 20, oxygen saturation is 94% on room air.  HEENT:  Head normocephalic, atraumatic.  Eyes, no scleral icterus.  Conjunctivae normal.  Pupils equal and reactive.  Extraocular movements are intact.  Mucous membranes moist.  Mild dryness.  No pharyngeal erythema.  NECK:  Supple.  No lymphadenopathy.  No JVD.  No carotid bruit.  No thyromegaly.  CHEST:  Has no focal tenderness.  LUNGS:  Bilaterally clear to auscultation.  HEART:  S1, S2 regular.  No murmurs are heard.  ABDOMEN:  Bowel sounds plus.  Soft, nontender, nondistended.  No hepatosplenomegaly.  Obese abdomen.  EXTREMITIES:  No pedal edema.  Pulses 2+.  Has changes consistent with chronic vascular disease.  SKIN:  No rash or lesions.  MUSCULOSKELETAL:  Good range of motion in all the extremities.  NEUROLOGIC:  The patient is alert, oriented to place, person and time.  Cranial nerves II through XII intact.  Motor 5 by 5 in upper and lower extremities.   LABORATORY DATA:  BMP, CBC are completely within normal limits.  Troponin less  than 0.02.  CT head without contrast:  Chronic microvascular changes, moderate global brain atrophy with somewhat disproportion effacement of the convexities which can be seen with a normal pressure hydrocephalus, moderate to severe white matter changes suggestive of chronic small vessel ischemic disease, similar  of the intracranial vessels which may be seen with chronic hypotension.  EKG 12 lead, normal sinus rhythm with no ST-T wave abnormalities.   ASSESSMENT AND PLAN:  Mr. Justin Mcdonald is an [age] year old male who comes to the Emergency Department after having an episode of syncope.  1.  Syncope.  Differential diagnosis, orthostatic hypotension, possible autonomic dysfunction, vasovagal syncope, cardiac event or possible arrhythmias.  Admit the patient under observation.  Continue with IV fluids.  Check the orthostatic pressures.  Continue to cycle cardiac enzymes x 3.  Obtain echocardiogram and carotid Dopplers.  Admit the patient to the telemetry looking for possible arrhythmias.  2.  Severe debility.  We will involve physical therapy, occupational therapy.  3.  Questionable normal pressure hydrocephalus.  Consider getting a neurology consult.  4.  Keep the patient on deep vein thrombosis prophylaxis with Lovenox.  TIME SPENT:  45 minutes.     ____________________________ Susa GriffinsPadmaja Tashawn Laswell, MD pv:ea D: 09/16/2013 23:15:17 ET T: 09/16/2013 23:47:58 ET JOB#: [id number]  cc: Susa GriffinsPadmaja Marizol Borror, MD, <Dictator> Margaretann LovelessNeelam S. Khan, MD Clerance LavPADMAJA Syble Picco MD ELECTRONICALLY SIGNED 10/01/2013 1:26
